# Patient Record
Sex: Male | Born: 1960 | Hispanic: No | Marital: Married | Smoking: Never smoker
Health system: Southern US, Community
[De-identification: ages and names within clinical notes are randomized; demographics above are authoritative.]

## PROBLEM LIST (undated history)

## (undated) DIAGNOSIS — Z86711 Personal history of pulmonary embolism: Secondary | ICD-10-CM

## (undated) DIAGNOSIS — E7800 Pure hypercholesterolemia, unspecified: Secondary | ICD-10-CM

## (undated) DIAGNOSIS — R519 Headache, unspecified: Secondary | ICD-10-CM

## (undated) DIAGNOSIS — I82409 Acute embolism and thrombosis of unspecified deep veins of unspecified lower extremity: Secondary | ICD-10-CM

## (undated) DIAGNOSIS — J189 Pneumonia, unspecified organism: Secondary | ICD-10-CM

## (undated) HISTORY — PX: TONSILLECTOMY: SUR1361

---
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
## 1999-10-20 HISTORY — PX: BACK SURGERY: SHX140

## 2014-10-19 HISTORY — PX: LASIK: SHX215

## 2017-09-18 DIAGNOSIS — Z86711 Personal history of pulmonary embolism: Secondary | ICD-10-CM

## 2017-09-18 DIAGNOSIS — I82409 Acute embolism and thrombosis of unspecified deep veins of unspecified lower extremity: Secondary | ICD-10-CM

## 2017-09-18 HISTORY — DX: Acute embolism and thrombosis of unspecified deep veins of unspecified lower extremity: I82.409

## 2017-09-18 HISTORY — DX: Personal history of pulmonary embolism: Z86.711

## 2018-07-06 ENCOUNTER — Other Ambulatory Visit: Payer: Self-pay | Admitting: Orthopedic Surgery

## 2018-07-06 DIAGNOSIS — M87851 Other osteonecrosis, right femur: Secondary | ICD-10-CM

## 2018-07-08 ENCOUNTER — Ambulatory Visit: Payer: Self-pay | Admitting: Orthopedic Surgery

## 2018-07-13 ENCOUNTER — Ambulatory Visit
Admission: RE | Admit: 2018-07-13 | Discharge: 2018-07-13 | Disposition: A | Discharge status: Home / Self Care | Payer: Self-pay | Source: Ambulatory Visit | Attending: Orthopedic Surgery | Admitting: Orthopedic Surgery

## 2018-07-13 DIAGNOSIS — M87851 Other osteonecrosis, right femur: Secondary | ICD-10-CM

## 2018-07-22 ENCOUNTER — Other Ambulatory Visit (HOSPITAL_COMMUNITY): Payer: Self-pay | Admitting: Orthopedic Surgery

## 2018-07-22 DIAGNOSIS — M25551 Pain in right hip: Secondary | ICD-10-CM

## 2018-07-27 ENCOUNTER — Other Ambulatory Visit: Payer: Self-pay | Admitting: Orthopedic Surgery

## 2018-08-01 ENCOUNTER — Ambulatory Visit: Payer: Self-pay | Admitting: Orthopedic Surgery

## 2018-08-01 DIAGNOSIS — M87051 Idiopathic aseptic necrosis of right femur: Secondary | ICD-10-CM

## 2018-08-01 NOTE — H&P (View-Only) (Signed)
TOTAL HIP ADMISSION H&P  Patient is admitted for right total hip arthroplasty.  Subjective:  Chief Complaint: right hip pain  HPI: John Guzman, 57 y.o. male, has a history of pain and functional disability in the right hip(s) due to AVN and patient has failed non-surgical conservative treatments for greater than 12 weeks to include NSAID's and/or analgesics, flexibility and strengthening excercises, use of assistive devices, weight reduction as appropriate and activity modification.  Onset of symptoms was abrupt starting 2 years ago with gradually worsening course since that time.The patient noted no past surgery on the right hip(s).  Patient currently rates pain in the right hip at 10 out of 10 with activity. Patient has night pain, worsening of pain with activity and weight bearing, trendelenberg gait, pain that interfers with activities of daily living and pain with passive range of motion. Patient has evidence of subchondral sclerosis by imaging studies. This condition presents safety issues increasing the risk of falls. This patient has had avascular necrosis of the hip. There is no current active infection.  There are no active problems to display for this patient.  Past Medical History:  Diagnosis Date  . DVT of lower extremity (deep venous thrombosis) (HCC) 09/2017   Right Lower Extremity  . Elevated cholesterol   . Hx of pulmonary embolus 09/2017    Past Surgical History:  Procedure Laterality Date  . ANKLE SURGERY Right 1981   metal in place   . BACK SURGERY  2001  . IR IVC FILTER PLMT / S&I /IMG GUID/MOD SED  08/17/2018  . IR RADIOLOGIST EVAL & MGMT  08/04/2018  . LASIK Bilateral 2016    No current facility-administered medications for this visit.    No current outpatient medications on file.   Facility-Administered Medications Ordered in Other Visits  Medication Dose Route Frequency Provider Last Rate Last Dose  . 0.9 %  sodium chloride infusion   Intravenous  Continuous Alyanah Elliott, MD      . acetaminophen (OFIRMEV) 10 MG/ML IV           . ceFAZolin (ANCEF) IVPB 2g/100 mL premix  2 g Intravenous On Call to OR Linkon Siverson, MD      . chlorhexidine (HIBICLENS) 4 % liquid 4 application  60 mL Topical Once Jamelle Goldston, MD      . chlorhexidine (HIBICLENS) 4 % liquid 4 application  60 mL Topical Once Lovelee Forner, MD      . povidone-iodine 10 % swab 2 application  2 application Topical Once Eland Lamantia, MD      . tranexamic acid (CYKLOKAPRON) IVPB 1,000 mg  1,000 mg Intravenous To OR Gee Habig, MD       No Known Allergies  Social History   Tobacco Use  . Smoking status: Never Smoker  . Smokeless tobacco: Never Used  Substance Use Topics  . Alcohol use: Yes    Alcohol/week: 5.0 standard drinks    Types: 5 Cans of beer per week    No family history on file.   Review of Systems  Constitutional: Negative.   HENT: Negative.   Eyes: Negative.   Respiratory: Negative.   Cardiovascular: Negative.   Gastrointestinal: Positive for blood in stool and diarrhea.  Genitourinary: Positive for frequency.  Musculoskeletal: Positive for back pain, joint pain and myalgias.  Skin: Negative.   Neurological: Negative.   Endo/Heme/Allergies: Negative.   Psychiatric/Behavioral: Negative.     Objective:  Physical Exam  Vitals reviewed. Constitutional: He is oriented to person,   place, and time. He appears well-developed and well-nourished.  HENT:  Head: Normocephalic and atraumatic.  Eyes: Pupils are equal, round, and reactive to light. Conjunctivae are normal.  Cardiovascular: Normal rate, regular rhythm and intact distal pulses.  Respiratory: Effort normal. No respiratory distress.  GI: Soft. He exhibits no distension.  Genitourinary:  Genitourinary Comments: deferred  Musculoskeletal:       Right hip: He exhibits decreased range of motion and bony tenderness.  Neurological: He is alert and oriented to person, place, and time.  He has normal reflexes.  Skin: Skin is warm and dry.  Psychiatric: He has a normal mood and affect. His behavior is normal. Judgment and thought content normal.    Vital signs in last 24 hours: @VSRANGES@  Labs:   Estimated body mass index is 30.21 kg/m as calculated from the following:   Height as of 08/24/18: 5' 6.5" (1.689 m).   Weight as of 08/24/18: 86.2 kg.   Imaging Review Plain radiographs demonstrate AVN of the right hip(s). The bone quality appears to be adequate for age and reported activity level.    Preoperative templating of the joint replacement has been completed, documented, and submitted to the Operating Room personnel in order to optimize intra-operative equipment management.     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 The treatment options including medical management, injection therapy, arthroscopy and arthroplasty were discussed at length. The risks and benefits of total hip arthroplasty were presented and reviewed. The risks due to aseptic loosening, infection, stiffness, dislocation/subluxation,  thromboembolic complications and other imponderables were discussed.  The patient acknowledged the explanation, agreed to proceed with the plan and consent was signed. Patient is being admitted for inpatient treatment for surgery, pain control, PT, OT, prophylactic antibiotics, VTE prophylaxis, progressive ambulation and ADL's and discharge planning.The patient is planning to be discharged home with HEP. Will need DME. Temp IVC filter ordered due to h/o PE. 

## 2018-08-01 NOTE — H&P (Signed)
TOTAL HIP ADMISSION H&P  Patient is admitted for right total hip arthroplasty.  Subjective:  Chief Complaint: right hip pain  HPI: John Guzman, 57 y.o. male, has a history of pain and functional disability in the right hip(s) due to AVN and patient has failed non-surgical conservative treatments for greater than 12 weeks to include NSAID's and/or analgesics, flexibility and strengthening excercises, use of assistive devices, weight reduction as appropriate and activity modification.  Onset of symptoms was abrupt starting 2 years ago with gradually worsening course since that time.The patient noted no past surgery on the right hip(s).  Patient currently rates pain in the right hip at 10 out of 10 with activity. Patient has night pain, worsening of pain with activity and weight bearing, trendelenberg gait, pain that interfers with activities of daily living and pain with passive range of motion. Patient has evidence of subchondral sclerosis by imaging studies. This condition presents safety issues increasing the risk of falls. This patient has had avascular necrosis of the hip. There is no current active infection.  There are no active problems to display for this patient.  Past Medical History:  Diagnosis Date  . DVT of lower extremity (deep venous thrombosis) (HCC) 09/2017   Right Lower Extremity  . Elevated cholesterol   . Hx of pulmonary embolus 09/2017    Past Surgical History:  Procedure Laterality Date  . ANKLE SURGERY Right 1981   metal in place   . BACK SURGERY  2001  . IR IVC FILTER PLMT / S&I /IMG GUID/MOD SED  08/17/2018  . IR RADIOLOGIST EVAL & MGMT  08/04/2018  . LASIK Bilateral 2016    No current facility-administered medications for this visit.    No current outpatient medications on file.   Facility-Administered Medications Ordered in Other Visits  Medication Dose Route Frequency Provider Last Rate Last Dose  . 0.9 %  sodium chloride infusion   Intravenous  Continuous Jennessy Sandridge, Arlys John, MD      . acetaminophen (OFIRMEV) 10 MG/ML IV           . ceFAZolin (ANCEF) IVPB 2g/100 mL premix  2 g Intravenous On Call to OR Lavonia Eager, Arlys John, MD      . chlorhexidine (HIBICLENS) 4 % liquid 4 application  60 mL Topical Once Spirit Wernli, Arlys John, MD      . chlorhexidine (HIBICLENS) 4 % liquid 4 application  60 mL Topical Once Andreia Gandolfi, Arlys John, MD      . povidone-iodine 10 % swab 2 application  2 application Topical Once Jonny Longino, Arlys John, MD      . tranexamic acid (CYKLOKAPRON) IVPB 1,000 mg  1,000 mg Intravenous To OR Johnrobert Foti, Arlys John, MD       No Known Allergies  Social History   Tobacco Use  . Smoking status: Never Smoker  . Smokeless tobacco: Never Used  Substance Use Topics  . Alcohol use: Yes    Alcohol/week: 5.0 standard drinks    Types: 5 Cans of beer per week    No family history on file.   Review of Systems  Constitutional: Negative.   HENT: Negative.   Eyes: Negative.   Respiratory: Negative.   Cardiovascular: Negative.   Gastrointestinal: Positive for blood in stool and diarrhea.  Genitourinary: Positive for frequency.  Musculoskeletal: Positive for back pain, joint pain and myalgias.  Skin: Negative.   Neurological: Negative.   Endo/Heme/Allergies: Negative.   Psychiatric/Behavioral: Negative.     Objective:  Physical Exam  Vitals reviewed. Constitutional: He is oriented to person,  place, and time. He appears well-developed and well-nourished.  HENT:  Head: Normocephalic and atraumatic.  Eyes: Pupils are equal, round, and reactive to light. Conjunctivae are normal.  Cardiovascular: Normal rate, regular rhythm and intact distal pulses.  Respiratory: Effort normal. No respiratory distress.  GI: Soft. He exhibits no distension.  Genitourinary:  Genitourinary Comments: deferred  Musculoskeletal:       Right hip: He exhibits decreased range of motion and bony tenderness.  Neurological: He is alert and oriented to person, place, and time.  He has normal reflexes.  Skin: Skin is warm and dry.  Psychiatric: He has a normal mood and affect. His behavior is normal. Judgment and thought content normal.    Vital signs in last 24 hours: @VSRANGES @  Labs:   Estimated body mass index is 30.21 kg/m as calculated from the following:   Height as of 08/24/18: 5' 6.5" (1.689 m).   Weight as of 08/24/18: 86.2 kg.   Imaging Review Plain radiographs demonstrate AVN of the right hip(s). The bone quality appears to be adequate for age and reported activity level.    Preoperative templating of the joint replacement has been completed, documented, and submitted to the Operating Room personnel in order to optimize intra-operative equipment management.     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 The treatment options including medical management, injection therapy, arthroscopy and arthroplasty were discussed at length. The risks and benefits of total hip arthroplasty were presented and reviewed. The risks due to aseptic loosening, infection, stiffness, dislocation/subluxation,  thromboembolic complications and other imponderables were discussed.  The patient acknowledged the explanation, agreed to proceed with the plan and consent was signed. Patient is being admitted for inpatient treatment for surgery, pain control, PT, OT, prophylactic antibiotics, VTE prophylaxis, progressive ambulation and ADL's and discharge planning.The patient is planning to be discharged home with HEP. Will need DME. Temp IVC filter ordered due to h/o PE.

## 2018-08-04 ENCOUNTER — Ambulatory Visit
Admission: RE | Admit: 2018-08-04 | Discharge: 2018-08-04 | Disposition: A | Discharge status: Home / Self Care | Payer: Self-pay | Source: Ambulatory Visit | Attending: Orthopedic Surgery | Admitting: Orthopedic Surgery

## 2018-08-04 DIAGNOSIS — I2699 Other pulmonary embolism without acute cor pulmonale: Secondary | ICD-10-CM

## 2018-08-04 HISTORY — DX: Acute embolism and thrombosis of unspecified deep veins of unspecified lower extremity: I82.409

## 2018-08-04 HISTORY — DX: Personal history of pulmonary embolism: Z86.711

## 2018-08-04 HISTORY — PX: IR RADIOLOGIST EVAL & MGMT: IMG5224

## 2018-08-04 NOTE — Consult Note (Signed)
Chief Complaint: Patient was seen in consultation today for  Chief Complaint  Patient presents with  . Consult    Consult for IVC placement     at the request of Swinteck,Brian  Referring Physician(s): Swinteck,Brian  History of Present Illness: John Guzman is a 57 y.o. male with a history of pulmonary emboli and right lower extremity DVT diagnosed in December 2018, currently on Eliquis.  He is scheduled for right hip replacement surgery on  August 24, 2018 and is referred for consultation regarding a treatable IVC filter placement.  No history of IVC filter.  No history of abdominal surgery.  No other known venous pathology.  No diagnosis of coagulopathy or blood dyscrasia.   Past Medical History:  Diagnosis Date  . DVT of lower extremity (deep venous thrombosis) (HCC) 09/2017   Right Lower Extremity  . Hx of pulmonary embolus 09/2017      Allergies: Patient has no known allergies.  Medications: Prior to Admission medications   Medication Sig Start Date End Date Taking? Authorizing Provider  acetaminophen (TYLENOL) 500 MG tablet Take 500 mg by mouth every 6 (six) hours as needed.   Yes [provider]  apixaban (ELIQUIS) 5 MG TABS tablet Take 5 mg by mouth 2 (two) times daily.   Yes [provider]     No family history on file.  Social History   Socioeconomic History  . Marital status: Married    Spouse name: Not on file  . Number of children: Not on file  . Years of education: Not on file  . Highest education level: Not on file  Occupational History  . Not on file  Social Needs  . Financial resource strain: Not on file  . Food insecurity:    Worry: Not on file    Inability: Not on file  . Transportation needs:    Medical: Not on file    Non-medical: Not on file  Tobacco Use  . Smoking status: Never Smoker  . Smokeless tobacco: Never Used  Substance and Sexual Activity  . Alcohol use: Yes    Alcohol/week: 5.0 standard  drinks    Types: 5 Cans of beer per week  . Drug use: Never  . Sexual activity: Not on file  Lifestyle  . Physical activity:    Days per week: Not on file    Minutes per session: Not on file  . Stress: Not on file  Relationships  . Social connections:    Talks on phone: Not on file    Gets together: Not on file    Attends religious service: Not on file    Active member of club or organization: Not on file    Attends meetings of clubs or organizations: Not on file    Relationship status: Not on file  Other Topics Concern  . Not on file  Social History Narrative  . Not on file    ECOG Status: 1 - Symptomatic but completely ambulatory    Physical Exam  Vital Signs: BP 123/68   Pulse 64   Temp 97.8 F (36.6 C) (Oral)   Resp 14   Ht 5\' 7"  (1.702 m)   Wt 85.3 kg   SpO2 97%   BMI 29.44 kg/m  Constitutional: Oriented to person, place, and time. Well-developed and well-nourished. No distress.  Last Weight  Most recent update: 08/04/2018  8:04 AM   Weight  85.3 kg (188 lb)  HENT:  Head: Normocephalic and atraumatic.  Eyes: Conjunctivae and EOM are normal. Right eye exhibits no discharge. Left eye exhibits no discharge. No scleral icterus.  Neck: No JVD present.  Pulmonary/Chest: Effort normal. No stridor. No respiratory distress.  Abdomen: soft, non distended Neurological:  alert and oriented to person, place, and time.  Skin: Skin is warm and dry.  not diaphoretic.  Psychiatric:   normal mood and affect.   behavior is normal. Judgment and thought content normal.   Mallampati Score:     Review of Systems Review of Systems: A 12 point ROS discussed and pertinent positives are indicated in the HPI above.  All other systems are negative. Imaging: No results found.  Labs:  CBC: No results for input(s): WBC, HGB, HCT, PLT in the last 8760 hours.  COAGS: No results for input(s): INR, APTT in the last 8760 hours.  BMP: No results for input(s): NA,  K, CL, CO2, GLUCOSE, BUN, CALCIUM, CREATININE, GFRNONAA, GFRAA in the last 8760 hours.  Invalid input(s): CMP  LIVER FUNCTION TESTS: No results for input(s): BILITOT, AST, ALT, ALKPHOS, PROT, ALBUMIN in the last 8760 hours.  TUMOR MARKERS: No results for input(s): AFPTM, CEA, CA199, CHROMGRNA in the last 8760 hours.  Assessment and Plan:  My impression is that this patient is an appropriate candidate for retrievable IVC filter placement as PE prophylaxis during the time of hip replacement surgery and postop recovery.  Once safely beyond surgery and able to resume anticoagulation, we can see him back for filter retrieval as he likely would not need this lifelong.  I reviewed with the patient and his wife the pathophysiology of venous thromboembolic disease.  We reviewed the action of anticoagulants and the IVC filter.  We reviewed that the filter does not prevent DVT but does significantly decrease likelihood of symptomatic pulmonary emboli.  As I have no access to   or records of any cross-sectional imaging of his abdominal venous anatomy, we reviewed the need to perform venography to confirm venous anatomy and establish a safe place for filter placement.  We reviewed in detail the technique of transjugular IVC filter placement and retrieval.  We reviewed the   indications for filter placement.  We reviewed possible risks and complications of IVC filters.  They seemed understand and did ask appropriate questions,  Which were all answered.  He is motivated to proceed.  Accordingly, we can set this up as an outpatient under moderate sedation at his convenience.  Thank you for this interesting consult.  I greatly enjoyed meeting John Guzman and look forward to participating in their care.  A copy of this report was sent to the requesting provider on this date.  Electronically Signed: Durwin Glaze 08/04/2018, 8:34 AM   I spent a total of  30 Minutes   in face to face in clinical  consultation, greater than 50% of which was counseling/coordinating care for perioperative caval filtration.

## 2018-08-09 ENCOUNTER — Other Ambulatory Visit (HOSPITAL_COMMUNITY): Payer: Self-pay | Admitting: Interventional Radiology

## 2018-08-09 DIAGNOSIS — Z86718 Personal history of other venous thrombosis and embolism: Secondary | ICD-10-CM

## 2018-08-15 ENCOUNTER — Encounter (HOSPITAL_COMMUNITY): Payer: Self-pay

## 2018-08-15 ENCOUNTER — Other Ambulatory Visit: Payer: Self-pay | Admitting: Radiology

## 2018-08-15 NOTE — Progress Notes (Signed)
Clearance Dr Arlana Pouch 06-29-18 on chart   EKG 04-2018 on chart from [facility]   LOV Dr Arlana Pouch 04-24-18 on chart

## 2018-08-15 NOTE — Patient Instructions (Signed)
John Guzman  08/15/2018   Your procedure is scheduled on: 08-24-18   Report to [facility] Main  Entrance    Report to admitting at 6:00AM    Call this number if you have problems the morning of surgery [phone]     Remember: Do not eat food or drink liquids :After Midnight. BRUSH YOUR TEETH MORNING OF SURGERY AND RINSE YOUR MOUTH OUT, NO CHEWING GUM CANDY OR MINTS.     Take these medicines the morning of surgery with A SIP OF WATER: none                                You may not have any metal on your body including hair pins and              piercings  Do not wear jewelry, make-up, lotions, powders or perfumes, deodorant                         Men may shave face and neck.   Do not bring valuables to the hospital. [facility] IS NOT             RESPONSIBLE   FOR VALUABLES.  Contacts, dentures or bridgework may not be worn into surgery.  Leave suitcase in the car. After surgery it may be brought to your room.                  Please read over the following fact sheets you were given: _____________________________________________________________________             [facility] - Preparing for Surgery Before surgery, you can play an important role.  Because skin is not sterile, your skin needs to be as free of germs as possible.  You can reduce the number of germs on your skin by washing with CHG (chlorahexidine gluconate) soap before surgery.  CHG is an antiseptic cleaner which kills germs and bonds with the skin to continue killing germs even after washing. Please DO NOT use if you have an allergy to CHG or antibacterial soaps.  If your skin becomes reddened/irritated stop using the CHG and inform your nurse when you arrive at Short Stay. Do not shave (including legs and underarms) for at least 48 hours prior to the first CHG shower.  You may shave your face/neck. Please follow these instructions carefully:  1.  Shower with CHG Soap the night  before surgery and the  morning of Surgery.  2.  If you choose to wash your hair, wash your hair first as usual with your  normal  shampoo.  3.  After you shampoo, rinse your hair and body thoroughly to remove the  shampoo.                           4.  Use CHG as you would any other liquid soap.  You can apply chg directly  to the skin and wash                       Gently with a scrungie or clean washcloth.  5.  Apply the CHG Soap to your body ONLY FROM THE NECK DOWN.   Do not use on face/ open  Wound or open sores. Avoid contact with eyes, ears mouth and genitals (private parts).                       Wash face,  Genitals (private parts) with your normal soap.             6.  Wash thoroughly, paying special attention to the area where your surgery  will be performed.  7.  Thoroughly rinse your body with warm water from the neck down.  8.  DO NOT shower/wash with your normal soap after using and rinsing off  the CHG Soap.                9.  Pat yourself dry with a clean towel.            10.  Wear clean pajamas.            11.  Place clean sheets on your bed the night of your first shower and do not  sleep with pets. Day of Surgery : Do not apply any lotions/deodorants the morning of surgery.  Please wear clean clothes to the hospital/surgery center.  FAILURE TO FOLLOW THESE INSTRUCTIONS MAY RESULT IN THE CANCELLATION OF YOUR SURGERY PATIENT SIGNATURE_________________________________  NURSE SIGNATURE__________________________________  ________________________________________________________________________   John Guzman  An incentive spirometer is a tool that can help keep your lungs clear and active. This tool measures how well you are filling your lungs with each breath. Taking long deep breaths may help reverse or decrease the chance of developing breathing (pulmonary) problems (especially infection) following:  A long period of time when you are  unable to move or be active. BEFORE THE PROCEDURE   If the spirometer includes an indicator to show your best effort, your nurse or respiratory therapist will set it to a desired goal.  If possible, sit up straight or lean slightly forward. Try not to slouch.  Hold the incentive spirometer in an upright position. INSTRUCTIONS FOR USE  1. Sit on the edge of your bed if possible, or sit up as far as you can in bed or on a chair. 2. Hold the incentive spirometer in an upright position. 3. Breathe out normally. 4. Place the mouthpiece in your mouth and seal your lips tightly around it. 5. Breathe in slowly and as deeply as possible, raising the piston or the ball toward the top of the column. 6. Hold your breath for 3-5 seconds or for as long as possible. Allow the piston or ball to fall to the bottom of the column. 7. Remove the mouthpiece from your mouth and breathe out normally. 8. Rest for a few seconds and repeat Steps 1 through 7 at least 10 times every 1-2 hours when you are awake. Take your time and take a few normal breaths between deep breaths. 9. The spirometer may include an indicator to show your best effort. Use the indicator as a goal to work toward during each repetition. 10. After each set of 10 deep breaths, practice coughing to be sure your lungs are clear. If you have an incision (the cut made at the time of surgery), support your incision when coughing by placing a pillow or rolled up towels firmly against it. Once you are able to get out of bed, walk around indoors and cough well. You may stop using the incentive spirometer when instructed by your caregiver.  RISKS AND COMPLICATIONS  Take your time so you do not get  dizzy or light-headed.  If you are in pain, you may need to take or ask for pain medication before doing incentive spirometry. It is harder to take a deep breath if you are having pain. AFTER USE  Rest and breathe slowly and easily.  It can be helpful to  keep track of a log of your progress. Your caregiver can provide you with a simple table to help with this. If you are using the spirometer at home, follow these instructions: [place] IF:   You are having difficultly using the spirometer.  You have trouble using the spirometer as often as instructed.  Your pain medication is not giving enough relief while using the spirometer.  You develop fever of 100.5 F (38.1 C) or higher. SEEK IMMEDIATE MEDICAL CARE IF:   You cough up bloody sputum that had not been present before.  You develop fever of 102 F (38.9 C) or greater.  You develop worsening pain at or near the incision site. MAKE SURE YOU:   Understand these instructions.  Will watch your condition.  Will get help right away if you are not doing well or get worse. Document Released: 02/15/2007 Document Revised: 12/28/2011 Document Reviewed: 04/18/2007 ExitCare Patient Information 2014 ExitCare, [place].   ________________________________________________________________________  WHAT IS A BLOOD TRANSFUSION? Blood Transfusion Information  A transfusion is the replacement of blood or some of its parts. Blood is made up of multiple cells which provide different functions.  Red blood cells carry oxygen and are used for blood loss replacement.  White blood cells fight against infection.  Platelets control bleeding.  Plasma helps clot blood.  Other blood products are available for specialized needs, such as hemophilia or other clotting disorders. BEFORE THE TRANSFUSION  Who gives blood for transfusions?   Healthy volunteers who are fully evaluated to make sure their blood is safe. This is blood bank blood. Transfusion therapy is the safest it has ever been in the practice of medicine. Before blood is taken from a donor, a complete history is taken to make sure that person has no history of diseases nor engages in risky social behavior (examples are intravenous drug  use or sexual activity with multiple partners). The donor's travel history is screened to minimize risk of transmitting infections, such as malaria. The donated blood is tested for signs of infectious diseases, such as HIV and hepatitis. The blood is then tested to be sure it is compatible with you in order to minimize the chance of a transfusion reaction. If you or a relative donates blood, this is often done in anticipation of surgery and is not appropriate for emergency situations. It takes many days to process the donated blood. RISKS AND COMPLICATIONS Although transfusion therapy is very safe and saves many lives, the main dangers of transfusion include:   Getting an infectious disease.  Developing a transfusion reaction. This is an allergic reaction to something in the blood you were given. Every precaution is taken to prevent this. The decision to have a blood transfusion has been considered carefully by your caregiver before blood is given. Blood is not given unless the benefits outweigh the risks. AFTER THE TRANSFUSION  Right after receiving a blood transfusion, you will usually feel much better and more energetic. This is especially true if your red blood cells have gotten low (anemic). The transfusion raises the level of the red blood cells which carry oxygen, and this usually causes an energy increase.  The nurse administering the transfusion will  monitor you carefully for complications. HOME CARE INSTRUCTIONS  No special instructions are needed after a transfusion. You may find your energy is better. Speak with your caregiver about any limitations on activity for underlying diseases you may have. SEEK MEDICAL CARE IF:   Your condition is not improving after your transfusion.  You develop redness or irritation at the intravenous (IV) site. SEEK IMMEDIATE MEDICAL CARE IF:  Any of the following symptoms occur over the next 12 hours:  Shaking chills.  You have a temperature by mouth  above 102 F (38.9 C), not controlled by medicine.  Chest, back, or muscle pain.  People around you feel you are not acting correctly or are confused.  Shortness of breath or difficulty breathing.  Dizziness and fainting.  You get a rash or develop hives.  You have a decrease in urine output.  Your urine turns a dark color or changes to pink, red, or brown. Any of the following symptoms occur over the next 10 days:  You have a temperature by mouth above 102 F (38.9 C), not controlled by medicine.  Shortness of breath.  Weakness after normal activity.  The white part of the eye turns yellow (jaundice).  You have a decrease in the amount of urine or are urinating less often.  Your urine turns a dark color or changes to pink, red, or brown. Document Released: 10/02/2000 Document Revised: 12/28/2011 Document Reviewed: 05/21/2008 [facility] Patient Information 2014 [place], [place].  _______________________________________________________________________

## 2018-08-17 ENCOUNTER — Ambulatory Visit (HOSPITAL_COMMUNITY)
Admission: RE | Admit: 2018-08-17 | Discharge: 2018-08-17 | Disposition: A | Discharge status: Home / Self Care | Payer: No Typology Code available for payment source | Source: Ambulatory Visit | Attending: Interventional Radiology | Admitting: Interventional Radiology

## 2018-08-17 ENCOUNTER — Other Ambulatory Visit: Payer: Self-pay

## 2018-08-17 ENCOUNTER — Encounter (HOSPITAL_COMMUNITY): Payer: Self-pay | Admitting: Interventional Radiology

## 2018-08-17 DIAGNOSIS — Z7901 Long term (current) use of anticoagulants: Secondary | ICD-10-CM | POA: Insufficient documentation

## 2018-08-17 DIAGNOSIS — Z86711 Personal history of pulmonary embolism: Secondary | ICD-10-CM | POA: Insufficient documentation

## 2018-08-17 DIAGNOSIS — E7800 Pure hypercholesterolemia, unspecified: Secondary | ICD-10-CM | POA: Insufficient documentation

## 2018-08-17 DIAGNOSIS — I82503 Chronic embolism and thrombosis of unspecified deep veins of lower extremity, bilateral: Secondary | ICD-10-CM | POA: Diagnosis present

## 2018-08-17 DIAGNOSIS — Z86718 Personal history of other venous thrombosis and embolism: Secondary | ICD-10-CM

## 2018-08-17 HISTORY — PX: IR IVC FILTER PLMT / S&I /IMG GUID/MOD SED: IMG701

## 2018-08-17 LAB — CBC WITH DIFFERENTIAL/PLATELET
Abs Immature Granulocytes: 0.02 10*3/uL (ref 0.00–0.07)
BASOS ABS: 0.1 10*3/uL (ref 0.0–0.1)
BASOS PCT: 1 %
EOS ABS: 0.2 10*3/uL (ref 0.0–0.5)
EOS PCT: 4 %
HCT: 47.9 % (ref 39.0–52.0)
Hemoglobin: 15.1 g/dL (ref 13.0–17.0)
IMMATURE GRANULOCYTES: 0 %
Lymphocytes Relative: 40 %
Lymphs Abs: 2 10*3/uL (ref 0.7–4.0)
MCH: 29 pg (ref 26.0–34.0)
MCHC: 31.5 g/dL (ref 30.0–36.0)
MCV: 92.1 fL (ref 80.0–100.0)
Monocytes Absolute: 0.5 10*3/uL (ref 0.1–1.0)
Monocytes Relative: 9 %
NEUTROS PCT: 46 %
NRBC: 0 % (ref 0.0–0.2)
Neutro Abs: 2.3 10*3/uL (ref 1.7–7.7)
PLATELETS: 244 10*3/uL (ref 150–400)
RBC: 5.2 MIL/uL (ref 4.22–5.81)
RDW: 12.3 % (ref 11.5–15.5)
WBC: 5 10*3/uL (ref 4.0–10.5)

## 2018-08-17 LAB — BASIC METABOLIC PANEL
ANION GAP: 9 (ref 5–15)
BUN: 21 mg/dL — ABNORMAL HIGH (ref 6–20)
CALCIUM: 8.8 mg/dL — AB (ref 8.9–10.3)
CO2: 22 mmol/L (ref 22–32)
CREATININE: 0.86 mg/dL (ref 0.61–1.24)
Chloride: 109 mmol/L (ref 98–111)
Glucose, Bld: 102 mg/dL — ABNORMAL HIGH (ref 70–99)
Potassium: 4.3 mmol/L (ref 3.5–5.1)
Sodium: 140 mmol/L (ref 135–145)

## 2018-08-17 LAB — PROTIME-INR
INR: 1
Prothrombin Time: 13.1 seconds (ref 11.4–15.2)

## 2018-08-17 MED ORDER — IOPAMIDOL (ISOVUE-300) INJECTION 61%
50.0000 mL | Freq: Once | INTRAVENOUS | Status: AC | PRN
  Administered 2018-08-17: 35 mL via INTRAVENOUS

## 2018-08-17 MED ORDER — LIDOCAINE HCL (PF) 1 % IJ SOLN
INTRAMUSCULAR | Status: AC | PRN
  Administered 2018-08-17: 5 mL via SUBCUTANEOUS

## 2018-08-17 MED ORDER — FENTANYL CITRATE (PF) 100 MCG/2ML IJ SOLN
INTRAMUSCULAR | Status: AC | PRN
  Administered 2018-08-17: 25 ug via INTRAVENOUS
  Administered 2018-08-17: 50 ug via INTRAVENOUS

## 2018-08-17 MED ORDER — MIDAZOLAM HCL 2 MG/2ML IJ SOLN
INTRAMUSCULAR | Status: AC | PRN
  Administered 2018-08-17 (×2): 1 mg via INTRAVENOUS

## 2018-08-17 MED ORDER — LIDOCAINE HCL 1 % IJ SOLN
INTRAMUSCULAR | Status: AC
  Filled 2018-08-17: qty 20

## 2018-08-17 MED ORDER — IOPAMIDOL (ISOVUE-300) INJECTION 61%
INTRAVENOUS | Status: AC
  Administered 2018-08-17: 35 mL via INTRAVENOUS
  Filled 2018-08-17: qty 50

## 2018-08-17 MED ORDER — MIDAZOLAM HCL 2 MG/2ML IJ SOLN
INTRAMUSCULAR | Status: AC
  Filled 2018-08-17: qty 2

## 2018-08-17 MED ORDER — SODIUM CHLORIDE 0.9 % IV SOLN
INTRAVENOUS | Status: DC
  Administered 2018-08-17: 09:00:00 via INTRAVENOUS

## 2018-08-17 MED ORDER — IOHEXOL 300 MG/ML  SOLN
100.0000 mL | Freq: Once | INTRAMUSCULAR | Status: AC | PRN
  Administered 2018-08-17: 30 mL via INTRAVENOUS

## 2018-08-17 MED ORDER — FENTANYL CITRATE (PF) 100 MCG/2ML IJ SOLN
INTRAMUSCULAR | Status: AC
  Filled 2018-08-17: qty 2

## 2018-08-17 MED ORDER — ACETAMINOPHEN 10 MG/ML IV SOLN: 1000.0000 mg | INTRAVENOUS | Status: DC

## 2018-08-17 NOTE — Discharge Instructions (Signed)
Moderate Conscious Sedation, Adult, Care After °These instructions provide you with information about caring for yourself after your procedure. Your health care provider may also give you more specific instructions. Your treatment has been planned according to current medical practices, but problems sometimes occur. Call your health care provider if you have any problems or questions after your procedure. °What can I expect after the procedure? °After your procedure, it is common: °· To feel sleepy for several hours. °· To feel clumsy and have poor balance for several hours. °· To have poor judgment for several hours. °· To vomit if you eat too soon. ° °Follow these instructions at home: °For at least 24 hours after the procedure: ° °· Do not: °? Participate in activities where you could fall or become injured. °? Drive. °? Use heavy machinery. °? Drink alcohol. °? Take sleeping pills or medicines that cause drowsiness. °? Make important decisions or sign legal documents. °? Take care of children on your own. °· Rest. °Eating and drinking °· Follow the diet recommended by your health care provider. °· If you vomit: °? Drink water, juice, or soup when you can drink without vomiting. °? Make sure you have little or no nausea before eating solid foods. °General instructions °· Have a responsible adult stay with you until you are awake and alert. °· Take over-the-counter and prescription medicines only as told by your health care provider. °· If you smoke, do not smoke without supervision. °· Keep all follow-up visits as told by your health care provider. This is important. °Contact a health care provider if: °· You keep feeling nauseous or you keep vomiting. °· You feel light-headed. °· You develop a rash. °· You have a fever. °Get help right away if: °· You have trouble breathing. °This information is not intended to replace advice given to you by your health care provider. Make sure you discuss any questions you have  with your health care provider. °Document Released: 07/26/2013 Document Revised: 03/09/2016 Document Reviewed: 01/25/2016 °Elsevier Interactive Patient Education © 2018 [employer] ° ° °Inferior Vena Cava Filter Insertion, Care After °This sheet gives you information about how to care for yourself after your procedure. Your health care provider may also give you more specific instructions. If you have problems or questions, contact your health care provider. °What can I expect after the procedure? °After your procedure, it is common to have: °· Mild pain in the area where the filter was inserted. °· Mild bruising in the area where the filter was inserted. ° °Follow these instructions at home: °Insertion site care °· Follow instructions from your health care provider about how to take care of the site where a catheter was inserted at your neck or groin (insertion site). Make sure you: °? Wash your hands with soap and water before you change your bandage (dressing). If soap and water are not available, use hand sanitizer. °? Change your dressing as told by your health care provider.  You may remove your dressing tomorrow. °· Check your insertion site every day for signs of infection. Check for: °? More redness, swelling, or pain. °? More fluid or blood. °? Warmth. °? Pus or a bad smell. °· Keep the insertion site clean and dry. °· Do not shower, bathe, use a hot tub, or let the dressing get wet until your health care provider approves.  You may shower tomorrow. °General instructions °· Take over-the-counter and prescription medicines only as told by your health care provider. °·   Avoid heavy lifting or hard activities for 48 hours after the procedure or as told by your health care provider. °· Do not drive for 24 hours if you were given a a medicine to help you relax (sedative). °· Do not drive or use heavy machinery while taking prescription pain medicine. °· Do not go back to school or work until your health care  provider approves. °· Keep all follow-up visits as told by your health care provider. This is important. °Contact a health care provider if: °· You have more redness, swelling, or pain around your insertion site. °· You have more fluid or blood coming from your insertion site. °· Your insertion site feels warm to the touch. °· You have pus or a bad smell coming from your insertion site. °· You have a fever. °· You are dizzy. °· You have nausea and vomiting. °· You develop a rash. °Get help right away if: °· You develop chest pain, a cough, or difficulty breathing. °· You develop shortness of breath, feel faint, or pass out. °· You cough up blood. °· You have severe pain in your abdomen. °· You develop swelling and discoloration or pain in your legs. °· Your legs become pale and cold or blue. °· You develop weakness, difficulty moving your arms or legs, or balance problems. °· You develop problems with speech or vision. °These symptoms may represent a serious problem that is an emergency. Do not wait to see if the symptoms will go away. Get medical help right away. Call your local emergency services (911 in the [place] Do not drive yourself to the hospital. °Summary °· After your insertion procedure, it is common to have mild pain and bruising. °· Do not shower, bathe, use a hot tub, or let the dressing get wet until your health care provider approves. °· Every day, check for signs of infection where a catheter was inserted at your neck or groin (insertion site). °This information is not intended to replace advice given to you by your health care provider. Make sure you discuss any questions you have with your health care provider. °Document Released: 07/26/2013 Document Revised: 08/26/2016 Document Reviewed: 08/26/2016 °Elsevier Interactive Patient Education © 2017 Elsevier Inc. ° ° ° ° ° °

## 2018-08-17 NOTE — H&P (Signed)
Referring Physician(s): Swinteck,B  Supervising Physician: Gilmer Mor  Patient Status:  [facility] OP  Chief Complaint:  "I'm having a filter put in"  Subjective: Patient familiar to IR service from recent consultation with Dr. Deanne Coffer on 08/04/2018 to discuss placement of an IVC filter.  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  He is scheduled for right hip replacement surgery on 08/24/2018 and IVC filter placement is requested as PE prophylaxis during the time of surgery and postop recovery.  He currently denies fever, headache, chest pain, dyspnea, cough, abdominal pain, nausea, vomiting or bleeding.  He does have some occasional back pain/rt hip pain.   Past Medical History:  Diagnosis Date  . DVT of lower extremity (deep venous thrombosis) (HCC) 09/2017   Right Lower Extremity  . Elevated cholesterol   . Hx of pulmonary embolus 09/2017      Allergies: Patient has no known allergies.  Medications: Prior to Admission medications   Medication Sig Start Date End Date Taking? Authorizing Provider  apixaban (ELIQUIS) 5 MG TABS tablet Take 5 mg by mouth 2 (two) times daily.   Yes [provider]  acetaminophen (TYLENOL) 500 MG tablet Take 500 mg by mouth every 6 (six) hours as needed.    [provider]     Vital Signs: BP 122/79   Pulse (!) 57   Temp 98.3 F (36.8 C) (Oral)   Resp 16   Ht 5\' 7"  (1.702 m)   Wt 188 lb (85.3 kg)   SpO2 98%   BMI 29.44 kg/m   Physical Exam awake, alert.  Chest clear to auscultation bilaterally.  Heart with slightly bradycardic but regular rhythm.  Abdomen soft, positive bowel sounds, nontender.  Some mild bilateral lower extremity edema.  Imaging: No results found.  Labs:  CBC: Recent Labs    08/17/18 0757  WBC 5.0  HGB 15.1  HCT 47.9  PLT 244    COAGS: Recent Labs    08/17/18 0757  INR 1.00    BMP: Recent Labs     08/17/18 0757  NA 140  K 4.3  CL 109  CO2 22  GLUCOSE 102*  BUN 21*  CALCIUM 8.8*  CREATININE 0.86  GFRNONAA >60  GFRAA >60    LIVER FUNCTION TESTS: No results for input(s): BILITOT, AST, ALT, ALKPHOS, PROT, ALBUMIN in the last 8760 hours.  Assessment and Plan: Pt with history of pulmonary emboli and right lower extremity DVT diagnosed in December 2018, currently on Eliquis.  He is scheduled for right hip replacement surgery on 08/24/2018 and IVC filter placement is requested as PE prophylaxis during the time of surgery and postop recovery.  He was seen in consultation by Dr. Deanne Coffer on 08/04/2018 and deemed an appropriate candidate for filter placement.  He presents today for the procedure.Risks and benefits discussed with the patient/spouse including, but not limited to bleeding, infection, contrast induced renal failure, filter fracture or migration which can lead to emergency surgery or even death, strut penetration with damage or irritation to adjacent structures and caval thrombosis.  All of the patient's questions were answered, patient is agreeable to proceed. Consent signed and in chart.     Electronically Signed: D. Jeananne Rama, PA-C 08/17/2018, 9:12 AM   I spent a total of 25 minutes  at the the patient's bedside AND on the patient's hospital floor or unit, greater than 50% of which was counseling/coordinating care for IVC  filter placement

## 2018-08-17 NOTE — Procedures (Signed)
Interventional Radiology Procedure Note  Procedure: Placement of an IVC filter, retrievable Bard Denali, via right IJ approach.   Complications: None Recommendations:  - Ok to shower tomorrow - Do not submerge for 7 days - Routine care - When able to have IVC filter removed, please have patient referred for follow up at VIR clinic.  [phone]   Signed,  Yvone Neu. Loreta Ave, DO

## 2018-08-18 ENCOUNTER — Encounter (HOSPITAL_COMMUNITY): Payer: Self-pay

## 2018-08-18 ENCOUNTER — Encounter (HOSPITAL_COMMUNITY)
Admission: RE | Admit: 2018-08-18 | Discharge: 2018-08-18 | Disposition: A | Discharge status: Home / Self Care | Payer: No Typology Code available for payment source | Source: Ambulatory Visit | Attending: Orthopedic Surgery | Admitting: Orthopedic Surgery

## 2018-08-18 ENCOUNTER — Other Ambulatory Visit: Payer: Self-pay

## 2018-08-18 DIAGNOSIS — Z01812 Encounter for preprocedural laboratory examination: Secondary | ICD-10-CM | POA: Diagnosis not present

## 2018-08-18 DIAGNOSIS — M1611 Unilateral primary osteoarthritis, right hip: Secondary | ICD-10-CM | POA: Insufficient documentation

## 2018-08-18 HISTORY — DX: Pure hypercholesterolemia, unspecified: E78.00

## 2018-08-18 LAB — SURGICAL PCR SCREEN
MRSA, PCR: NEGATIVE
Staphylococcus aureus: POSITIVE — AB

## 2018-08-18 LAB — ABO/RH: ABO/RH(D): A POS

## 2018-08-24 ENCOUNTER — Inpatient Hospital Stay (HOSPITAL_COMMUNITY)
Admission: RE | Admit: 2018-08-24 | Discharge: 2018-08-25 | DRG: 470 | Disposition: A | Discharge status: Home / Self Care | Payer: Non-veteran care | Source: Ambulatory Visit | Attending: Orthopedic Surgery | Admitting: Orthopedic Surgery

## 2018-08-24 ENCOUNTER — Encounter (HOSPITAL_COMMUNITY): Payer: Self-pay | Admitting: General Practice

## 2018-08-24 ENCOUNTER — Inpatient Hospital Stay (HOSPITAL_COMMUNITY): Payer: Non-veteran care

## 2018-08-24 ENCOUNTER — Other Ambulatory Visit: Payer: Self-pay

## 2018-08-24 ENCOUNTER — Encounter (HOSPITAL_COMMUNITY)
Admission: RE | Disposition: A | Discharge status: Home / Self Care | Payer: Self-pay | Source: Ambulatory Visit | Attending: Orthopedic Surgery

## 2018-08-24 ENCOUNTER — Inpatient Hospital Stay (HOSPITAL_COMMUNITY): Payer: Non-veteran care | Admitting: Certified Registered Nurse Anesthetist

## 2018-08-24 DIAGNOSIS — M87051 Idiopathic aseptic necrosis of right femur: Secondary | ICD-10-CM

## 2018-08-24 DIAGNOSIS — E7800 Pure hypercholesterolemia, unspecified: Secondary | ICD-10-CM | POA: Diagnosis present

## 2018-08-24 DIAGNOSIS — M879 Osteonecrosis, unspecified: Principal | ICD-10-CM | POA: Diagnosis present

## 2018-08-24 DIAGNOSIS — Z86711 Personal history of pulmonary embolism: Secondary | ICD-10-CM | POA: Diagnosis not present

## 2018-08-24 DIAGNOSIS — Z09 Encounter for follow-up examination after completed treatment for conditions other than malignant neoplasm: Secondary | ICD-10-CM

## 2018-08-24 DIAGNOSIS — M1611 Unilateral primary osteoarthritis, right hip: Secondary | ICD-10-CM | POA: Diagnosis present

## 2018-08-24 DIAGNOSIS — Z86718 Personal history of other venous thrombosis and embolism: Secondary | ICD-10-CM | POA: Diagnosis not present

## 2018-08-24 HISTORY — PX: TOTAL HIP ARTHROPLASTY: SHX124

## 2018-08-24 LAB — TYPE AND SCREEN
ABO/RH(D): A POS
ANTIBODY SCREEN: NEGATIVE

## 2018-08-24 SURGERY — ARTHROPLASTY, HIP, TOTAL, ANTERIOR APPROACH
Anesthesia: Spinal | Site: Hip | Laterality: Right

## 2018-08-24 MED ORDER — HYDROCODONE-ACETAMINOPHEN 5-325 MG PO TABS
1.0000 | ORAL_TABLET | ORAL | Status: DC | PRN
  Administered 2018-08-25: 2 via ORAL
  Filled 2018-08-24: qty 2

## 2018-08-24 MED ORDER — 0.9 % SODIUM CHLORIDE (POUR BTL) OPTIME
TOPICAL | Status: DC | PRN
  Administered 2018-08-24: 1000 mL

## 2018-08-24 MED ORDER — ONDANSETRON HCL 4 MG/2ML IJ SOLN
INTRAMUSCULAR | Status: DC | PRN
  Administered 2018-08-24: 4 mg via INTRAVENOUS

## 2018-08-24 MED ORDER — MIDAZOLAM HCL 2 MG/2ML IJ SOLN
INTRAMUSCULAR | Status: AC
  Filled 2018-08-24: qty 2

## 2018-08-24 MED ORDER — PHENOL 1.4 % MT LIQD: 1.0000 | OROMUCOSAL | Status: DC | PRN

## 2018-08-24 MED ORDER — METOCLOPRAMIDE HCL 5 MG/ML IJ SOLN: 5.0000 mg | Freq: Three times a day (TID) | INTRAMUSCULAR | Status: DC | PRN

## 2018-08-24 MED ORDER — DOCUSATE SODIUM 100 MG PO CAPS
100.0000 mg | ORAL_CAPSULE | Freq: Two times a day (BID) | ORAL | Status: DC
  Administered 2018-08-24 – 2018-08-25 (×2): 100 mg via ORAL
  Filled 2018-08-24 (×2): qty 1

## 2018-08-24 MED ORDER — KETOROLAC TROMETHAMINE 30 MG/ML IJ SOLN
INTRAMUSCULAR | Status: DC | PRN
  Administered 2018-08-24: 30 mg

## 2018-08-24 MED ORDER — DIPHENHYDRAMINE HCL 12.5 MG/5ML PO ELIX: 12.5000 mg | ORAL_SOLUTION | ORAL | Status: DC | PRN

## 2018-08-24 MED ORDER — SODIUM CHLORIDE 0.9 % IV SOLN: INTRAVENOUS | Status: DC

## 2018-08-24 MED ORDER — EPHEDRINE SULFATE-NACL 50-0.9 MG/10ML-% IV SOSY
PREFILLED_SYRINGE | INTRAVENOUS | Status: DC | PRN
  Administered 2018-08-24 (×2): 5 mg via INTRAVENOUS

## 2018-08-24 MED ORDER — METHOCARBAMOL 500 MG IVPB - SIMPLE MED
INTRAVENOUS | Status: AC
  Administered 2018-08-24: 500 mg
  Filled 2018-08-24: qty 50

## 2018-08-24 MED ORDER — BUPIVACAINE IN DEXTROSE 0.75-8.25 % IT SOLN
INTRATHECAL | Status: DC | PRN
  Administered 2018-08-24: 2 mL via INTRATHECAL

## 2018-08-24 MED ORDER — FENTANYL CITRATE (PF) 100 MCG/2ML IJ SOLN
INTRAMUSCULAR | Status: DC | PRN
  Administered 2018-08-24: 50 ug via INTRAVENOUS

## 2018-08-24 MED ORDER — SENNA 8.6 MG PO TABS
1.0000 | ORAL_TABLET | Freq: Two times a day (BID) | ORAL | Status: DC
  Administered 2018-08-24 – 2018-08-25 (×2): 8.6 mg via ORAL
  Filled 2018-08-24 (×2): qty 1

## 2018-08-24 MED ORDER — MENTHOL 3 MG MT LOZG: 1.0000 | LOZENGE | OROMUCOSAL | Status: DC | PRN

## 2018-08-24 MED ORDER — KETOROLAC TROMETHAMINE 30 MG/ML IJ SOLN
INTRAMUSCULAR | Status: AC
  Filled 2018-08-24: qty 1

## 2018-08-24 MED ORDER — KETOROLAC TROMETHAMINE 15 MG/ML IJ SOLN
15.0000 mg | Freq: Four times a day (QID) | INTRAMUSCULAR | Status: AC
  Administered 2018-08-24 – 2018-08-25 (×4): 15 mg via INTRAVENOUS
  Filled 2018-08-24 (×4): qty 1

## 2018-08-24 MED ORDER — LACTATED RINGERS IV SOLN
Freq: Once | INTRAVENOUS | Status: AC
  Administered 2018-08-24 (×2): via INTRAVENOUS

## 2018-08-24 MED ORDER — METHOCARBAMOL 500 MG IVPB - SIMPLE MED
500.0000 mg | Freq: Four times a day (QID) | INTRAVENOUS | Status: DC | PRN
  Filled 2018-08-24: qty 50

## 2018-08-24 MED ORDER — POVIDONE-IODINE 10 % EX SWAB: 2.0000 "application " | Freq: Once | CUTANEOUS | Status: DC

## 2018-08-24 MED ORDER — ALUM & MAG HYDROXIDE-SIMETH 200-200-20 MG/5ML PO SUSP: 30.0000 mL | ORAL | Status: DC | PRN

## 2018-08-24 MED ORDER — BUPIVACAINE HCL (PF) 0.25 % IJ SOLN
INTRAMUSCULAR | Status: AC
  Filled 2018-08-24: qty 30

## 2018-08-24 MED ORDER — HYDROCODONE-ACETAMINOPHEN 7.5-325 MG PO TABS
1.0000 | ORAL_TABLET | ORAL | Status: DC | PRN
  Administered 2018-08-24 – 2018-08-25 (×4): 1 via ORAL
  Filled 2018-08-24 (×4): qty 1

## 2018-08-24 MED ORDER — ACETAMINOPHEN 10 MG/ML IV SOLN
INTRAVENOUS | Status: DC | PRN
  Administered 2018-08-24: 1000 mg via INTRAVENOUS

## 2018-08-24 MED ORDER — MORPHINE SULFATE (PF) 2 MG/ML IV SOLN: 0.5000 mg | INTRAVENOUS | Status: DC | PRN

## 2018-08-24 MED ORDER — ISOPROPYL ALCOHOL 70 % SOLN
Status: AC
  Filled 2018-08-24: qty 480

## 2018-08-24 MED ORDER — SODIUM CHLORIDE 0.9 % IJ SOLN
INTRAMUSCULAR | Status: DC | PRN
  Administered 2018-08-24: 30 mL

## 2018-08-24 MED ORDER — ISOPROPYL ALCOHOL 70 % SOLN
Status: DC | PRN
  Administered 2018-08-24: 1 via TOPICAL

## 2018-08-24 MED ORDER — CEFAZOLIN SODIUM-DEXTROSE 2-4 GM/100ML-% IV SOLN
2.0000 g | INTRAVENOUS | Status: AC
  Administered 2018-08-24: 2 g via INTRAVENOUS
  Filled 2018-08-24: qty 100

## 2018-08-24 MED ORDER — DEXAMETHASONE SODIUM PHOSPHATE 10 MG/ML IJ SOLN
INTRAMUSCULAR | Status: AC
  Filled 2018-08-24: qty 1

## 2018-08-24 MED ORDER — DEXAMETHASONE SODIUM PHOSPHATE 10 MG/ML IJ SOLN
INTRAMUSCULAR | Status: DC | PRN
  Administered 2018-08-24: 10 mg via INTRAVENOUS

## 2018-08-24 MED ORDER — PROPOFOL 10 MG/ML IV BOLUS
INTRAVENOUS | Status: AC
  Filled 2018-08-24: qty 60

## 2018-08-24 MED ORDER — CHLORHEXIDINE GLUCONATE 4 % EX LIQD: 60.0000 mL | Freq: Once | CUTANEOUS | Status: DC

## 2018-08-24 MED ORDER — METOCLOPRAMIDE HCL 5 MG PO TABS: 5.0000 mg | ORAL_TABLET | Freq: Three times a day (TID) | ORAL | Status: DC | PRN

## 2018-08-24 MED ORDER — METHOCARBAMOL 500 MG PO TABS: 500.0000 mg | ORAL_TABLET | Freq: Four times a day (QID) | ORAL | Status: DC | PRN

## 2018-08-24 MED ORDER — MIDAZOLAM HCL 5 MG/5ML IJ SOLN
INTRAMUSCULAR | Status: DC | PRN
  Administered 2018-08-24: 2 mg via INTRAVENOUS

## 2018-08-24 MED ORDER — ACETAMINOPHEN 10 MG/ML IV SOLN
INTRAVENOUS | Status: AC
  Filled 2018-08-24: qty 100

## 2018-08-24 MED ORDER — APIXABAN 2.5 MG PO TABS
2.5000 mg | ORAL_TABLET | Freq: Two times a day (BID) | ORAL | Status: DC
  Administered 2018-08-25: 2.5 mg via ORAL
  Filled 2018-08-24: qty 1

## 2018-08-24 MED ORDER — PROPOFOL 500 MG/50ML IV EMUL
INTRAVENOUS | Status: DC | PRN
  Administered 2018-08-24: 50 ug/kg/min via INTRAVENOUS

## 2018-08-24 MED ORDER — KETOROLAC TROMETHAMINE 15 MG/ML IJ SOLN
INTRAMUSCULAR | Status: AC
  Administered 2018-08-24: 15 mg
  Filled 2018-08-24: qty 1

## 2018-08-24 MED ORDER — ONDANSETRON HCL 4 MG PO TABS: 4.0000 mg | ORAL_TABLET | Freq: Four times a day (QID) | ORAL | Status: DC | PRN

## 2018-08-24 MED ORDER — ONDANSETRON HCL 4 MG/2ML IJ SOLN: 4.0000 mg | Freq: Four times a day (QID) | INTRAMUSCULAR | Status: DC | PRN

## 2018-08-24 MED ORDER — ONDANSETRON HCL 4 MG/2ML IJ SOLN
INTRAMUSCULAR | Status: AC
  Filled 2018-08-24: qty 2

## 2018-08-24 MED ORDER — FENTANYL CITRATE (PF) 100 MCG/2ML IJ SOLN
INTRAMUSCULAR | Status: AC
  Filled 2018-08-24: qty 2

## 2018-08-24 MED ORDER — SODIUM CHLORIDE 0.9 % IR SOLN
Status: DC | PRN
  Administered 2018-08-24: 1000 mL

## 2018-08-24 MED ORDER — PROPOFOL 10 MG/ML IV BOLUS
INTRAVENOUS | Status: AC
  Filled 2018-08-24: qty 20

## 2018-08-24 MED ORDER — BUPIVACAINE HCL (PF) 0.25 % IJ SOLN
INTRAMUSCULAR | Status: DC | PRN
  Administered 2018-08-24: 30 mL

## 2018-08-24 MED ORDER — POLYETHYLENE GLYCOL 3350 17 G PO PACK: 17.0000 g | PACK | Freq: Every day | ORAL | Status: DC | PRN

## 2018-08-24 MED ORDER — ACETAMINOPHEN 325 MG PO TABS: 325.0000 mg | ORAL_TABLET | Freq: Four times a day (QID) | ORAL | Status: DC | PRN

## 2018-08-24 MED ORDER — DEXAMETHASONE SODIUM PHOSPHATE 10 MG/ML IJ SOLN
10.0000 mg | Freq: Once | INTRAMUSCULAR | Status: AC
  Administered 2018-08-25: 10 mg via INTRAVENOUS
  Filled 2018-08-24: qty 1

## 2018-08-24 MED ORDER — PROPOFOL 10 MG/ML IV BOLUS
INTRAVENOUS | Status: DC | PRN
  Administered 2018-08-24 (×2): 20 mg via INTRAVENOUS

## 2018-08-24 MED ORDER — TRANEXAMIC ACID-NACL 1000-0.7 MG/100ML-% IV SOLN
1000.0000 mg | INTRAVENOUS | Status: AC
  Administered 2018-08-24: 1000 mg via INTRAVENOUS
  Filled 2018-08-24: qty 100

## 2018-08-24 MED ORDER — CEFAZOLIN SODIUM-DEXTROSE 2-4 GM/100ML-% IV SOLN
2.0000 g | Freq: Four times a day (QID) | INTRAVENOUS | Status: AC
  Administered 2018-08-24 (×2): 2 g via INTRAVENOUS
  Filled 2018-08-24 (×2): qty 100

## 2018-08-24 MED ORDER — SODIUM CHLORIDE 0.9 % IV SOLN
INTRAVENOUS | Status: DC
  Administered 2018-08-24 – 2018-08-25 (×3): via INTRAVENOUS

## 2018-08-24 MED ORDER — WATER FOR IRRIGATION, STERILE IR SOLN
Status: DC | PRN
  Administered 2018-08-24: 2000 mL

## 2018-08-24 SURGICAL SUPPLY — 48 items
BAG DECANTER FOR FLEXI CONT (MISCELLANEOUS) IMPLANT
BAG ZIPLOCK 12X15 (MISCELLANEOUS) IMPLANT
CHLORAPREP W/TINT 26ML (MISCELLANEOUS) ×3 IMPLANT
CLOTH BEACON ORANGE TIMEOUT ST (SAFETY) ×3 IMPLANT
COVER PERINEAL POST (MISCELLANEOUS) ×3 IMPLANT
COVER SURGICAL LIGHT HANDLE (MISCELLANEOUS) ×3 IMPLANT
COVER WAND RF STERILE (DRAPES) IMPLANT
CUP ACET PNNCL SECTR W/GRIP 56 (Hips) ×1 IMPLANT
DECANTER SPIKE VIAL GLASS SM (MISCELLANEOUS) ×3 IMPLANT
DERMABOND ADVANCED (GAUZE/BANDAGES/DRESSINGS) ×4
DERMABOND ADVANCED .7 DNX12 (GAUZE/BANDAGES/DRESSINGS) ×2 IMPLANT
DRAPE SHEET LG 3/4 BI-LAMINATE (DRAPES) ×9 IMPLANT
DRAPE STERI IOBAN 125X83 (DRAPES) ×3 IMPLANT
DRAPE U-SHAPE 47X51 STRL (DRAPES) ×6 IMPLANT
DRSG AQUACEL AG ADV 3.5X10 (GAUZE/BANDAGES/DRESSINGS) ×3 IMPLANT
ELECT PENCIL ROCKER SW 15FT (MISCELLANEOUS) ×3 IMPLANT
ELECT REM PT RETURN 15FT ADLT (MISCELLANEOUS) ×3 IMPLANT
GAUZE SPONGE 4X4 12PLY STRL (GAUZE/BANDAGES/DRESSINGS) ×3 IMPLANT
GLOVE BIO SURGEON STRL SZ8.5 (GLOVE) ×6 IMPLANT
GLOVE BIOGEL PI IND STRL 8.5 (GLOVE) ×1 IMPLANT
GLOVE BIOGEL PI INDICATOR 8.5 (GLOVE) ×2
GOWN SPEC L3 XXLG W/TWL (GOWN DISPOSABLE) ×3 IMPLANT
HANDPIECE INTERPULSE COAX TIP (DISPOSABLE) ×2
HEAD CERAMIC 36 PLUS5 (Hips) ×3 IMPLANT
HOLDER FOLEY CATH W/STRAP (MISCELLANEOUS) ×3 IMPLANT
HOOD PEEL AWAY FLYTE STAYCOOL (MISCELLANEOUS) ×12 IMPLANT
LINER NEUTRAL 52MMX36MMX56N (Liner) ×3 IMPLANT
MARKER SKIN DUAL TIP RULER LAB (MISCELLANEOUS) ×3 IMPLANT
NEEDLE SPNL 18GX3.5 QUINCKE PK (NEEDLE) ×3 IMPLANT
PACK ANTERIOR HIP CUSTOM (KITS) ×3 IMPLANT
PINN SECTOR W/GRIP ACE CUP 56 (Hips) ×3 IMPLANT
SAW OSC TIP CART 19.5X105X1.3 (SAW) ×3 IMPLANT
SEALER BIPOLAR AQUA 6.0 (INSTRUMENTS) ×3 IMPLANT
SET HNDPC FAN SPRY TIP SCT (DISPOSABLE) ×1 IMPLANT
STEM TRI LOC GRIPTION SZ 5 STD ×1 IMPLANT
SUT ETHIBOND NAB CT1 #1 30IN (SUTURE) ×6 IMPLANT
SUT MNCRL AB 3-0 PS2 18 (SUTURE) ×3 IMPLANT
SUT MON AB 2-0 CT1 36 (SUTURE) ×6 IMPLANT
SUT STRATAFIX PDO 1 14 VIOLET (SUTURE) ×2
SUT STRATFX PDO 1 14 VIOLET (SUTURE) ×1
SUT VIC AB 2-0 CT1 27 (SUTURE) ×2
SUT VIC AB 2-0 CT1 TAPERPNT 27 (SUTURE) ×1 IMPLANT
SUTURE STRATFX PDO 1 14 VIOLET (SUTURE) ×1 IMPLANT
SYR 50ML LL SCALE MARK (SYRINGE) ×3 IMPLANT
TRAY FOLEY MTR SLVR 16FR STAT (SET/KITS/TRAYS/PACK) IMPLANT
TRI LOC GRIPTION SZ 5 STD ×3 IMPLANT
WATER STERILE IRR 1000ML POUR (IV SOLUTION) ×3 IMPLANT
YANKAUER SUCT BULB TIP 10FT TU (MISCELLANEOUS) ×3 IMPLANT

## 2018-08-24 NOTE — Evaluation (Signed)
Physical Therapy Evaluation Patient Details Name: John Guzman MRN: [id number] DOB: 09-25-1961 Today's Date: 08/24/2018   History of Present Illness  Pt s/p R THR and with hx of back surgery, and R ankle surgery  Clinical Impression  Pt s/p R THR and presents with decreased R LE strength/ROM and post op pain limiting functional mobility.  Pt should progress to dc home with family assist.    Follow Up Recommendations Follow surgeon's recommendation for DC plan and follow-up therapies    Equipment Recommendations  None recommended by PT    Recommendations for Other Services       Precautions / Restrictions Precautions Precautions: Fall Restrictions Weight Bearing Restrictions: No Other Position/Activity Restrictions: WBAT      Mobility  Bed Mobility Overal bed mobility: Needs Assistance Bed Mobility: Supine to Sit     Supine to sit: Min guard     General bed mobility comments: cues for sequence and use of L LE to self assist  Transfers Overall transfer level: Needs assistance   Transfers: Sit to/from Stand Sit to Stand: Min assist;Min guard         General transfer comment: cues for LE management and use of UEs to self assist  Ambulation/Gait Ambulation/Gait assistance: Min assist;Min guard Gait Distance (Feet): 147 Feet Assistive device: Rolling walker (2 wheeled) Gait Pattern/deviations: Step-to pattern;Step-through pattern;Decreased step length - right;Decreased step length - left;Shuffle;Trunk flexed Gait velocity: decr   General Gait Details: cues for posture, position from RW and initial sequence  Stairs            Wheelchair Mobility    Modified Rankin (Stroke Patients Only)       Balance Overall balance assessment: Mild deficits observed, not formally tested                                           Pertinent Vitals/Pain Pain Assessment: 0-10 Pain Score: 4  Pain Location: R hip Pain Descriptors / Indicators:  Aching;Sore Pain Intervention(s): Limited activity within patient's tolerance;Monitored during session;Premedicated before session;Ice applied    Home Living Family/patient expects to be discharged to:: Private residence Living Arrangements: Spouse/significant other Available Help at Discharge: Family Type of Home: House Home Access: Stairs to enter Entrance Stairs-Rails: None Secretary/administrator of Steps: 2 Home Layout: One level Home Equipment: Environmental consultant - 2 wheels      Prior Function Level of Independence: Independent               Hand Dominance        Extremity/Trunk Assessment   Upper Extremity Assessment Upper Extremity Assessment: Overall WFL for tasks assessed    Lower Extremity Assessment Lower Extremity Assessment: RLE deficits/detail    Cervical / Trunk Assessment Cervical / Trunk Assessment: Normal  Communication   Communication: No difficulties  Cognition Arousal/Alertness: Awake/alert Behavior During Therapy: WFL for tasks assessed/performed Overall Cognitive Status: Within Functional Limits for tasks assessed                                        General Comments      Exercises Total Joint Exercises Ankle Circles/Pumps: AROM;Both;15 reps;Supine   Assessment/Plan    PT Assessment Patient needs continued PT services  PT Problem List Decreased strength;Decreased range of motion;Decreased activity tolerance;Decreased mobility;Decreased knowledge of  use of DME;Pain       PT Treatment Interventions DME instruction;Stair training;Gait training;Functional mobility training;Therapeutic activities;Therapeutic exercise;Patient/family education    PT Goals (Current goals can be found in the Care Plan section)  Acute Rehab PT Goals Patient Stated Goal: Regain IND PT Goal Formulation: With patient Time For Goal Achievement: 08/31/18 Potential to Achieve Goals: Good    Frequency 7X/week   Barriers to discharge         Co-evaluation               AM-PAC PT "6 Clicks" Daily Activity  Outcome Measure Difficulty turning over in bed (including adjusting bedclothes, sheets and blankets)?: Unable Difficulty moving from lying on back to sitting on the side of the bed? : Unable Difficulty sitting down on and standing up from a chair with arms (e.g., wheelchair, bedside commode, etc,.)?: Unable Help needed moving to and from a bed to chair (including a wheelchair)?: A Little Help needed walking in hospital room?: A Little Help needed climbing 3-5 steps with a railing? : A Little 6 Click Score: 12    End of Session Equipment Utilized During Treatment: Gait belt Activity Tolerance: Patient tolerated treatment well Patient left: in chair;with call bell/phone within reach;with family/visitor present Nurse Communication: Mobility status PT Visit Diagnosis: Difficulty in walking, not elsewhere classified (R26.2)    Time: 1610-9604 PT Time Calculation (min) (ACUTE ONLY): 35 min   Charges:   PT Evaluation $PT Eval Low Complexity: 1 Low PT Treatments $Gait Training: 8-22 mins        Mauro Kaufmann PT Acute Rehabilitation Services Pager [phone] Office [phone]   Kirsta Probert 08/24/2018, 3:43 PM

## 2018-08-24 NOTE — Transfer of Care (Signed)
Immediate Anesthesia Transfer of Care Note  Patient: John Guzman  Procedure(s) Performed: RIGHT TOTAL HIP ARTHROPLASTY ANTERIOR APPROACH (Right Hip)  Patient Location: PACU  Anesthesia Type:MAC and Spinal  Level of Consciousness: awake, alert , oriented and patient cooperative  Airway & Oxygen Therapy: Patient Spontanous Breathing and Patient connected to face mask oxygen  Post-op Assessment: Report given to RN and Post -op Vital signs reviewed and stable  Post vital signs: Reviewed and stable  Last Vitals:  Vitals Value Taken Time  BP    Temp    Pulse 71 08/24/2018 10:54 AM  Resp 15 08/24/2018 10:54 AM  SpO2 100 % 08/24/2018 10:54 AM  Vitals shown include unvalidated device data.  Last Pain:  Vitals:   08/24/18 0651  TempSrc: Oral  PainSc:       Patients Stated Pain Goal: 4 (83/81/84 0375)  Complications: No apparent anesthesia complications

## 2018-08-24 NOTE — Progress Notes (Signed)
PT Cancellation Note  Patient Details Name: John Guzman MRN: [id number] DOB: [birth year]   Cancelled Treatment:    Reason Eval/Treat Not Completed: Medical issues which prohibited therapy(spinal hasn't worn off yet. Will check back later today. )   Tamala Ser PT 08/24/2018  Acute Rehabilitation Services Pager [phone] Office [phone]

## 2018-08-24 NOTE — Discharge Instructions (Signed)
°Dr. Kahliya Fraleigh °Joint Replacement Specialist °[facility] °[address] °[place], [place] [address] °[phone] ° ° °TOTAL HIP REPLACEMENT POSTOPERATIVE DIRECTIONS ° ° ° °Hip Rehabilitation, Guidelines Following Surgery  ° °WEIGHT BEARING °Weight bearing as tolerated with assist device (walker, cane, etc) as directed, use it as long as suggested by your surgeon or therapist, typically at least 4-6 weeks. ° °The results of a hip operation are greatly improved after range of motion and muscle strengthening exercises. Follow all safety measures which are given to protect your hip. If any of these exercises cause increased pain or swelling in your joint, decrease the amount until you are comfortable again. Then slowly increase the exercises. Call your caregiver if you have problems or questions.  ° °HOME CARE INSTRUCTIONS  °Most of the following instructions are designed to prevent the dislocation of your new hip.  °Remove items at home which could result in a fall. This includes throw rugs or furniture in walking pathways.  °Continue medications as instructed at time of discharge. °· You may have some home medications which will be placed on hold until you complete the course of blood thinner medication. °· You may start showering once you are discharged home. Do not remove your dressing. °Do not put on socks or shoes without following the instructions of your caregivers.   °Sit on chairs with arms. Use the chair arms to help push yourself up when arising.  °Arrange for the use of a toilet seat elevator so you are not sitting low.  °· Walk with walker as instructed.  °You may resume a sexual relationship in one month or when given the OK by your caregiver.  °Use walker as long as suggested by your caregivers.  °You may put full weight on your legs and walk as much as is comfortable. °Avoid periods of inactivity such as sitting longer than an hour when not asleep. This helps prevent  blood clots.  °You may return to work once you are cleared by your surgeon.  °Do not drive a car for 6 weeks or until released by your surgeon.  °Do not drive while taking narcotics.  °Wear elastic stockings for two weeks following surgery during the day but you may remove then at night.  °Make sure you keep all of your appointments after your operation with all of your doctors and caregivers. You should call the office at the above phone number and make an appointment for approximately two weeks after the date of your surgery. °Please pick up a stool softener and laxative for home use as long as you are requiring pain medications. °· ICE to the affected hip every three hours for 30 minutes at a time and then as needed for pain and swelling. Continue to use ice on the hip for pain and swelling from surgery. You may notice swelling that will progress down to the foot and ankle.  This is normal after surgery.  Elevate the leg when you are not up walking on it.   °It is important for you to complete the blood thinner medication as prescribed by your doctor. °· Continue to use the breathing machine which will help keep your temperature down.  It is common for your temperature to cycle up and down following surgery, especially at night when you are not up moving around and exerting yourself.  The breathing machine keeps your lungs expanded and your temperature down. ° °RANGE OF MOTION AND STRENGTHENING EXERCISES  °These exercises are   designed to help you keep full movement of your hip joint. Follow your caregiver's or physical therapist's instructions. Perform all exercises about fifteen times, three times per day or as directed. Exercise both hips, even if you have had only one joint replacement. These exercises can be done on a training (exercise) mat, on the floor, on a table or on a bed. Use whatever works the best and is most comfortable for you. Use music or television while you are exercising so that the exercises  are a pleasant break in your day. This will make your life better with the exercises acting as a break in routine you can look forward to.  °Lying on your back, slowly slide your foot toward your buttocks, raising your knee up off the floor. Then slowly slide your foot back down until your leg is straight again.  °Lying on your back spread your legs as far apart as you can without causing discomfort.  °Lying on your side, raise your upper leg and foot straight up from the floor as far as is comfortable. Slowly lower the leg and repeat.  °Lying on your back, tighten up the muscle in the front of your thigh (quadriceps muscles). You can do this by keeping your leg straight and trying to raise your heel off the floor. This helps strengthen the largest muscle supporting your knee.  °Lying on your back, tighten up the muscles of your buttocks both with the legs straight and with the knee bent at a comfortable angle while keeping your heel on the floor.  ° °SKILLED REHAB INSTRUCTIONS: °If the patient is transferred to a skilled rehab facility following release from the hospital, a list of the current medications will be sent to the facility for the patient to continue.  When discharged from the skilled rehab facility, please have the facility set up the patient's Home Health Physical Therapy prior to being released. Also, the skilled facility will be responsible for providing the patient with their medications at time of release from the facility to include their pain medication and their blood thinner medication. If the patient is still at the rehab facility at time of the two week follow up appointment, the skilled rehab facility will also need to assist the patient in arranging follow up appointment in our office and any transportation needs. ° °MAKE SURE YOU:  °Understand these instructions.  °Will watch your condition.  °Will get help right away if you are not doing well or get worse. ° °Pick up stool softner and  laxative for home use following surgery while on pain medications. °Do not remove your dressing. °The dressing is waterproof--it is OK to take showers. °Continue to use ice for pain and swelling after surgery. °Do not use any lotions or creams on the incision until instructed by your surgeon. °Total Hip Protocol. ° ° °

## 2018-08-24 NOTE — Anesthesia Preprocedure Evaluation (Signed)
Anesthesia Evaluation  Patient identified by MRN, date of birth, ID band Patient awake    Reviewed: Allergy & Precautions, NPO status , Patient's Chart, lab work & pertinent test results  Airway Mallampati: II  TM Distance: >3 FB Neck ROM: Full    Dental no notable dental hx.    Pulmonary neg pulmonary ROS,    Pulmonary exam normal breath sounds clear to auscultation       Cardiovascular negative cardio ROS Normal cardiovascular exam Rhythm:Regular Rate:Normal     Neuro/Psych negative neurological ROS  negative psych ROS   GI/Hepatic negative GI ROS, Neg liver ROS,   Endo/Other  negative endocrine ROS  Renal/GU negative Renal ROS  negative genitourinary   Musculoskeletal  (+) Arthritis ,   Abdominal   Peds negative pediatric ROS (+)  Hematology negative hematology ROS (+)   Anesthesia Other Findings   Reproductive/Obstetrics negative OB ROS                             Anesthesia Physical Anesthesia Plan  ASA: II  Anesthesia Plan: Spinal   Post-op Pain Management:    Induction: Intravenous  PONV Risk Score and Plan: Ondansetron  Airway Management Planned: Simple Face Mask  Additional Equipment:   Intra-op Plan:   Post-operative Plan:   Informed Consent: I have reviewed the patients History and Physical, chart, labs and discussed the procedure including the risks, benefits and alternatives for the proposed anesthesia with the patient or authorized representative who has indicated his/her understanding and acceptance.   Dental advisory given  Plan Discussed with: CRNA and Surgeon  Anesthesia Plan Comments:         Anesthesia Quick Evaluation

## 2018-08-24 NOTE — Anesthesia Postprocedure Evaluation (Signed)
Anesthesia Post Note  Patient: MCARTHUR IVINS  Procedure(s) Performed: RIGHT TOTAL HIP ARTHROPLASTY ANTERIOR APPROACH (Right Hip)     Patient location during evaluation: PACU Anesthesia Type: Spinal Level of consciousness: oriented and awake and alert Pain management: pain level controlled Vital Signs Assessment: post-procedure vital signs reviewed and stable Respiratory status: spontaneous breathing, respiratory function stable and patient connected to nasal cannula oxygen Cardiovascular status: blood pressure returned to baseline and stable Postop Assessment: no headache, no backache and no apparent nausea or vomiting Anesthetic complications: no    Last Vitals:  Vitals:   08/24/18 1145 08/24/18 1200  BP: 103/67 108/69  Pulse: (!) 50 (!) 52  Resp: 14 15  Temp:  (!) 36.4 C  SpO2: 100% 100%    Last Pain:  Vitals:   08/24/18 1200  TempSrc:   PainSc: 0-No pain                 Noah Lembke S

## 2018-08-24 NOTE — Anesthesia Procedure Notes (Signed)
Spinal  Patient location during procedure: OR Start time: 08/24/2018 8:41 AM End time: 08/24/2018 8:46 AM Staffing Anesthesiologist: Eilene Ghazi, MD Resident/CRNA: Epimenio Sarin, CRNA Performed: resident/CRNA  Preanesthetic Checklist Completed: patient identified, surgical consent, pre-op evaluation, timeout performed, IV checked, risks and benefits discussed and monitors and equipment checked Spinal Block Patient position: sitting Prep: DuraPrep Patient monitoring: heart rate, cardiac monitor, continuous pulse ox and blood pressure Approach: midline Location: L3-4 Injection technique: single-shot Needle Needle type: Pencan  Needle gauge: 24 G Needle length: 10 cm Needle insertion depth: 7.5 cm Assessment Sensory level: T6

## 2018-08-24 NOTE — Op Note (Signed)
OPERATIVE REPORT  SURGEON: Samson Frederic, MD   ASSISTANT: Skip Mayer, PA-C.  PREOPERATIVE DIAGNOSIS: Right hip avascular necrosis.   POSTOPERATIVE DIAGNOSIS: Right hip avascular necrosis.   PROCEDURE: Right total hip arthroplasty, anterior approach.   IMPLANTS: DePuy Tri Lock stem, size 5, std offset. DePuy Pinnacle Cup, size 56 mm. DePuy Altrx liner, size 36 by 56 mm, neutral. DePuy Biolox ceramic head ball, size 36 + 5 mm.  ANESTHESIA:  Spinal  ESTIMATED BLOOD LOSS:-300 mL    ANTIBIOTICS: 2 g Ancef.  DRAINS: None.  COMPLICATIONS: None.   CONDITION: PACU - hemodynamically stable.   BRIEF CLINICAL NOTE: John Guzman is a 57 y.o. male with a long-standing history of Right hip avascular necrosis. After failing conservative management, the patient was indicated for total hip arthroplasty. The risks, benefits, and alternatives to the procedure were explained, and the patient elected to proceed.  PROCEDURE IN DETAIL: Surgical site was marked by myself in the pre-op holding area. Once inside the operating room, spinal anesthesia was obtained, and a foley catheter was inserted. The patient was then positioned on the Hana table. All bony prominences were well padded. The hip was prepped and draped in the normal sterile surgical fashion. A time-out was called verifying side and site of surgery. The patient received IV antibiotics within 60 minutes of beginning the procedure.  The direct anterior approach to the hip was performed through the Hueter interval. Lateral femoral circumflex vessels were treated with the Auqumantys. The anterior capsule was exposed and an inverted T capsulotomy was made.The femoral neck cut was made to the level of the templated cut. A corkscrew was placed into the head and the head was removed. The head was passed to the back table and was measured.  Acetabular exposure was achieved, and the pulvinar and labrum were excised. Sequential  reaming of the acetabulum was then performed up to a size 55 mm reamer. A 56 mm cup was then opened and impacted into place at approximately 40 degrees of abduction and 20 degrees of anteversion. The final polyethylene liner was impacted into place and acetabular osteophytes were removed.   I then gained femoral exposure taking care to protect the abductors and greater trochanter. This was performed using standard external rotation, extension, and adduction. The capsule was peeled off the inner aspect of the greater trochanter, taking care to preserve the short external rotators. A cookie cutter was used to enter the femoral canal, and then the femoral canal finder was placed. Sequential broaching was performed up to a size 5. Calcar planer was used on the femoral neck remnant. I placed a std offset neck and a trial head ball. The hip was reduced. Leg lengths and offset were checked fluoroscopically. The hip was dislocated and trial components were removed. The final implants were placed, and the hip was reduced.  Fluoroscopy was used to confirm component position and leg lengths. At 90 degrees of external rotation and full extension, the hip was stable to an anterior directed force.  The wound was copiously irrigated with normal saline using pulse lavage. Marcaine solution was injected into the periarticular soft tissue. The wound was closed in layers using #1 Vicryl and V-Loc for the fascia, 2-0 Vicryl for the subcutaneous fat, 2-0 Monocryl for the deep dermal layer, 3-0 running Monocryl subcuticular stitch, and Dermabond for the skin. Once the glue was fully dried, an Aquacell Ag dressing was applied. The patient was transported to the recovery room in stable condition. Sponge, needle, and  instrument counts were correct at the end of the case x2. The patient tolerated the procedure well and there were no known complications.  Please note that a surgical assistant was a medical necessity  for this procedure to perform it in a safe and expeditious manner. Assistant was necessary to provide appropriate retraction of vital neurovascular structures, to prevent femoral fracture, and to allow for anatomic placement of the prosthesis.

## 2018-08-24 NOTE — Interval H&P Note (Signed)
History and Physical Interval Note:  08/24/2018 8:28 AM  John Guzman  has presented today for surgery, with the diagnosis of Avascular necrosis right hip  The various methods of treatment have been discussed with the patient and family. After consideration of risks, benefits and other options for treatment, the patient has consented to  Procedure(s) with comments: RIGHT TOTAL HIP ARTHROPLASTY ANTERIOR APPROACH (Right) - Needs RNFA as a surgical intervention .  The patient's history has been reviewed, patient examined, no change in status, stable for surgery.  I have reviewed the patient's chart and labs.  Questions were answered to the patient's satisfaction.    The risks, benefits, and alternatives were discussed with the patient. There are risks associated with the surgery including, but not limited to, problems with anesthesia (death), infection, instability (giving out of the joint), dislocation, differences in leg length/angulation/rotation, fracture of bones, loosening or failure of implants, hematoma (blood accumulation) which may require surgical drainage, blood clots, pulmonary embolism, nerve injury (foot drop and lateral thigh numbness), and blood vessel injury. The patient understands these risks and elects to proceed.   Iline Oven Kourtnie Sachs

## 2018-08-25 ENCOUNTER — Encounter (HOSPITAL_COMMUNITY): Payer: Self-pay | Admitting: Orthopedic Surgery

## 2018-08-25 LAB — BASIC METABOLIC PANEL
Anion gap: 7 (ref 5–15)
BUN: 12 mg/dL (ref 6–20)
CALCIUM: 8.6 mg/dL — AB (ref 8.9–10.3)
CO2: 26 mmol/L (ref 22–32)
CREATININE: 0.75 mg/dL (ref 0.61–1.24)
Chloride: 104 mmol/L (ref 98–111)
GFR calc Af Amer: >60 mL/min (ref >60)
GFR calc non Af Amer: >60 mL/min (ref >60)
Glucose, Bld: 151 mg/dL — ABNORMAL HIGH (ref 70–99)
Potassium: 4.7 mmol/L (ref 3.5–5.1)
Sodium: 137 mmol/L (ref 135–145)

## 2018-08-25 LAB — CBC
HEMATOCRIT: 41.5 % (ref 39.0–52.0)
Hemoglobin: 13.3 g/dL (ref 13.0–17.0)
MCH: 28.9 pg (ref 26.0–34.0)
MCHC: 32 g/dL (ref 30.0–36.0)
MCV: 90.2 fL (ref 80.0–100.0)
PLATELETS: 260 10*3/uL (ref 150–400)
RBC: 4.6 MIL/uL (ref 4.22–5.81)
RDW: 12.5 % (ref 11.5–15.5)
WBC: 12.5 10*3/uL — ABNORMAL HIGH (ref 4.0–10.5)
nRBC: 0 % (ref 0.0–0.2)

## 2018-08-25 MED ORDER — ONDANSETRON HCL 4 MG PO TABS: 4.0000 mg | ORAL_TABLET | Freq: Four times a day (QID) | ORAL | 0 refills | Status: DC | PRN

## 2018-08-25 MED ORDER — HYDROCODONE-ACETAMINOPHEN 5-325 MG PO TABS: 1.0000 | ORAL_TABLET | Freq: Four times a day (QID) | ORAL | 0 refills | Status: DC | PRN

## 2018-08-25 MED ORDER — DOCUSATE SODIUM 100 MG PO CAPS: 100.0000 mg | ORAL_CAPSULE | Freq: Two times a day (BID) | ORAL | 1 refills | Status: DC

## 2018-08-25 MED ORDER — SENNA 8.6 MG PO TABS: 1.0000 | ORAL_TABLET | Freq: Two times a day (BID) | ORAL | 0 refills | Status: DC

## 2018-08-25 NOTE — Progress Notes (Signed)
    Subjective:  Patient reports pain as mild to moderate.  Denies N/V/CP/SOB. No c/o.  Objective:   VITALS:   Vitals:   08/24/18 1751 08/24/18 2139 08/25/18 0125 08/25/18 0502  BP: 123/77 123/74 111/80 125/79  Pulse: 72 61 (!) 58 72  Resp: 16 16 14 15   Temp: 98.2 F (36.8 C) 98.4 F (36.9 C) 98.2 F (36.8 C) 98 F (36.7 C)  TempSrc: Oral Oral Oral Oral  SpO2: 96% 95% 96% 98%  Weight:      Height:        NAD ABD soft Sensation intact distally Intact pulses distally Dorsiflexion/Plantar flexion intact Incision: dressing C/D/I Compartment soft   Lab Results  Component Value Date   WBC 12.5 (H) 08/25/2018   HGB 13.3 08/25/2018   HCT 41.5 08/25/2018   MCV 90.2 08/25/2018   PLT 260 08/25/2018   BMET    Component Value Date/Time   NA 137 08/25/2018 0546   K 4.7 08/25/2018 0546   CL 104 08/25/2018 0546   CO2 26 08/25/2018 0546   GLUCOSE 151 (H) 08/25/2018 0546   BUN 12 08/25/2018 0546   CREATININE 0.75 08/25/2018 0546   CALCIUM 8.6 (L) 08/25/2018 0546   GFRNONAA >60 08/25/2018 0546   GFRAA >60 08/25/2018 0546    Patient's anticipated LOS is less than 2 midnights, meeting these requirements: - Younger than 44 - Lives within 1 hour of care - Has a competent adult at home to recover with post-op recover - NO history of  - Chronic pain requiring opiods  - Diabetes  - Coronary Artery Disease  - Heart failure  - Heart attack  - Stroke  - DVT/VTE  - Cardiac arrhythmia  - Respiratory Failure/COPD  - Renal failure  - Anemia  - Advanced Liver disease      Assessment/Plan: 1 Day Post-Op   Principal Problem:   Avascular necrosis of femoral head, right (HCC) Active Problems:   Avascular necrosis of hip, right (HCC)   WBAT with walker DVT ppx: apixaban, SCDs, TEDS PO pain control PT/OT Dispo: D/C home with HEP   Iline Oven Jayshaun Phillips 08/25/2018, 9:18 AM   Samson Frederic, MD Cell: [phone] [facility] is now [facility]   [place] [address], [address], [place], [place] [address] Phone: [phone] [email or web address] Facebook  [employer]

## 2018-08-25 NOTE — Discharge Summary (Signed)
Physician Discharge Summary  Patient ID: John Guzman MRN: [id number] DOB/AGE: May 28, 1961 57 y.o.  Admit date: 08/24/2018 Discharge date: 08/25/2018  Admission Diagnoses:  Avascular necrosis of femoral head, right [facility])  Discharge Diagnoses:  Principal Problem:   Avascular necrosis of femoral head, right (HCC) Active Problems:   Avascular necrosis of hip, right [facility])   Past Medical History:  Diagnosis Date  . DVT of lower extremity (deep venous thrombosis) (HCC) 09/2017   Right Lower Extremity  . Elevated cholesterol   . Hx of pulmonary embolus 09/2017    Surgeries: Procedure(s): RIGHT TOTAL HIP ARTHROPLASTY ANTERIOR APPROACH on 08/24/2018   Consultants (if any):   Discharged Condition: Improved  Hospital Course: John Guzman is an 57 y.o. male who was admitted 08/24/2018 with a diagnosis of Avascular necrosis of femoral head, right (HCC) and went to the operating room on 08/24/2018 and underwent the above named procedures.    He was given perioperative antibiotics:  Anti-infectives (From admission, onward)   Start     Dose/Rate Route Frequency Ordered Stop   08/24/18 1430  ceFAZolin (ANCEF) IVPB 2g/100 mL premix     2 g 200 mL/hr over 30 Minutes Intravenous Every 6 hours 08/24/18 1224 08/24/18 2142   08/24/18 0645  ceFAZolin (ANCEF) IVPB 2g/100 mL premix     2 g 200 mL/hr over 30 Minutes Intravenous On call to O.R. 08/24/18 7829 08/24/18 0850    .  He was given sequential compression devices, early ambulation, and apixaban for DVT prophylaxis.  He benefited maximally from the hospital stay and there were no complications.    Recent vital signs:  Vitals:   08/25/18 0125 08/25/18 0502  BP: 111/80 125/79  Pulse: (!) 58 72  Resp: 14 15  Temp: 98.2 F (36.8 C) 98 F (36.7 C)  SpO2: 96% 98%    Recent laboratory studies:  Lab Results  Component Value Date   HGB 13.3 08/25/2018   HGB 15.1 08/17/2018   Lab Results  Component Value Date   WBC 12.5 (H)  08/25/2018   PLT 260 08/25/2018   Lab Results  Component Value Date   INR 1.00 08/17/2018   Lab Results  Component Value Date   NA 137 08/25/2018   K 4.7 08/25/2018   CL 104 08/25/2018   CO2 26 08/25/2018   BUN 12 08/25/2018   CREATININE 0.75 08/25/2018   GLUCOSE 151 (H) 08/25/2018    Discharge Medications:   Allergies as of 08/25/2018   No Known Allergies     Medication List    STOP taking these medications   acetaminophen 500 MG tablet Commonly known as:  TYLENOL     TAKE these medications   docusate sodium 100 MG capsule Commonly known as:  COLACE Take 1 capsule (100 mg total) by mouth 2 (two) times daily.   ELIQUIS 5 MG Tabs tablet Generic drug:  apixaban Take 5 mg by mouth 2 (two) times daily.   HYDROcodone-acetaminophen 5-325 MG tablet Commonly known as:  NORCO/VICODIN Take 1-2 tablets by mouth every 6 (six) hours as needed for moderate pain.   ondansetron 4 MG tablet Commonly known as:  ZOFRAN Take 1 tablet (4 mg total) by mouth every 6 (six) hours as needed for nausea.   senna 8.6 MG Tabs tablet Commonly known as:  SENOKOT Take 1 tablet (8.6 mg total) by mouth 2 (two) times daily.       Diagnostic Studies: Ir Ivc Filter Plmt / S&i /img Guid/mod Sed  Result Date: 08/17/2018 CLINICAL DATA:  57 year old male with a history of DVT, on anticoagulation, with a pending surgery. He will be withdrawn from anticoagulation. He presents today for retrievable IVC filter placement. EXAM: 1. ULTRASOUND GUIDANCE FOR VASCULAR ACCESS OF THE RIGHT jugular VEIN. 2. IVC VENOGRAM. 3. PERCUTANEOUS IVC FILTER PLACEMENT. ANESTHESIA/SEDATION: 2.0 mg IV Versed; 75 mcg IV Fentanyl. Total Moderate Sedation Time Thirty-oneminutes. CONTRAST:  40 cc FLUOROSCOPY TIME:  8 minutes 18 seconds . PROCEDURE: The procedure, risks, benefits, and alternatives were explained to the patient. Specific risks discussed include bleeding, infection, contrast reaction, renal failure, IVC filter  fracture, migration, ileo caval thrombus (3% incidence), need for further procedure, need for further surgery, pulmonary embolism, cardiopulmonary collapse, death. Questions regarding the procedure were encouraged and answered. The patient understands and consents to the procedure. Ultrasound survey was performed with images stored and sent to PACs. The neck was prepped with Betadine in a sterile fashion, and a sterile drape was applied covering the operative field. A sterile gown and sterile gloves were used for the procedure. Local anesthesia was provided with 1% Lidocaine. A micropuncture needle was used access the right internal jugular vein under ultrasound. With excellent venous blood flow returned, and an .018 micro wire was passed through the needle. Small incision was made with an 11 blade scalpel. The needle was removed, and a micropuncture sheath was placed over the wire. The inner dilator and wire were removed, and an 035 Bentson wire was advanced under fluoroscopy into the IVC. Serial dilation of the soft tissue tract was performed with an 8 [place] dilator and subsequently a 10 [place] dilator. The delivery sheath for a retrievable Bard Denali filter was passed over the Bentson wire into the IVC. The wire was removed and small contrast was used to confirm IVC location. IVC cavagram performed. Dilator was removed, and the IVC filter was then delivered to the distal end of the delivery catheter. Upon initial pin pole release, the legs of the filter did not open, and the [place] attempted hree she thing was performed. At this point, the filter released from the delivery rod. The filter remained partially within the delivery catheter at this time. The delivery rod was removed and we advanced a 15 mm snare in attempt to snare the top end of the filter for a removal/stabilization. When the snare was deployed on the top into the filter the filter was pushed from the distal aspect of the catheter and did partially  release. Angiogram was performed. We determined that the initial placement was low and needed to be retrieved and replaced. The catheter was then snared with a 35 mm loop snare and removed. Given the malfunction of the initial IVC filter, the initial IVC filter was disposed and returned to the manufacturer. We then delivered a second IVC filter which deployed uneventfully, below the lowest renal vein. Final angiogram was performed. Manual pressure was used for hemostasis. Patient tolerated the procedure well and remained hemodynamically stable throughout. No complications were encountered and no significant blood loss was encounter. COMPLICATIONS: None. FINDINGS: IVC venography demonstrates a normal caliber IVC with no evidence of thrombus. Renal veins are identified bilaterally. The IVC filter was successfully positioned below the level of the renal veins and is appropriately oriented. This IVC filter has both permanent and retrievable indications. IMPRESSION: Status post placement of retrievable IVC filter. Signed, Yvone Neu. Reyne Dumas, RPVI [facility] Radiology PLAN: This IVC filter is retrievable. The patient will be assessed  for filter retrieval by Interventional Radiology in the intervention radiology clinic after the patient may be safely restarted on anticoagulation. Further recommendations regarding filter retrieval, continued surveillance or declaration of device permanence, will be made at that time. Electronically Signed   By: Gilmer Mor D.O.   On: 08/17/2018 11:20   Dg Pelvis Portable  Result Date: 08/24/2018 CLINICAL DATA:  Total hip replacement. EXAM: PORTABLE PELVIS 1-2 VIEWS COMPARISON:  08/24/2018. FINDINGS: Total right hip replacement. Hardware intact. Anatomic alignment. Diffuse osteopenia and degenerative change. IMPRESSION: Total right hip replacement. Anatomic alignment. No acute bony abnormality. Electronically Signed   By: Maisie Fus   Register   On: 08/24/2018 11:27   Dg C-arm 1-60 Min-no Report  Result Date: 08/24/2018 Fluoroscopy was utilized by the requesting physician.  No radiographic interpretation.   Dg Hip Operative Unilat W Or W/o Pelvis Right  Result Date: 08/24/2018 CLINICAL DATA:  Right total hip replacement. EXAM: OPERATIVE right HIP (WITH PELVIS IF PERFORMED) 2 VIEWS TECHNIQUE: Fluoroscopic spot image(s) were submitted for interpretation post-operatively. Radiation exposure index: 1.2849 mGy. COMPARISON:  None. FINDINGS: Two intraoperative fluoroscopic images demonstrate the patient be status post right total hip arthroplasty. The femoral and acetabular components appear to be well situated. IMPRESSION: Status post right total hip arthroplasty. Electronically Signed   By: Lupita Raider, M.D.   On: 08/24/2018 10:53   Ir Radiologist Eval & Mgmt  Result Date: 08/04/2018 Please refer to notes tab for details about interventional procedure. (Op Note)   Disposition: Discharge disposition: 01-Home or Self Care       Discharge Instructions    Call MD / Call 911   Complete by:  As directed    If you experience chest pain or shortness of breath, CALL 911 and be transported to the hospital emergency room.  If you develope a fever above 101 F, pus (white drainage) or increased drainage or redness at the wound, or calf pain, call your surgeon's office.   Constipation Prevention   Complete by:  As directed    Drink plenty of fluids.  Prune juice may be helpful.  You may use a stool softener, such as Colace (over the counter) 100 mg twice a day.  Use MiraLax (over the counter) for constipation as needed.   Diet - low sodium heart healthy   Complete by:  As directed    Driving restrictions   Complete by:  As directed    No driving for 6 weeks   Increase activity slowly as tolerated   Complete by:  As directed    Lifting restrictions   Complete by:  As directed    No lifting for 6 weeks   TED hose   Complete  by:  As directed    Use stockings (TED hose) for 2 weeks on both leg(s).  You may remove them at night for sleeping.      Follow-up Information    Lucille Witts, Arlys John, MD. Schedule an appointment as soon as possible for a visit in 2 weeks.   Specialty:  Orthopedic Surgery Why:  For wound re-check Contact information: [address] [place] [address] [phone]            Signed: Iline Oven Mechele Kittleson 08/25/2018, 9:20 AM

## 2018-08-25 NOTE — Progress Notes (Signed)
Physical Therapy Treatment Patient Details Name: John Guzman MRN: [id number] DOB: 1960-12-25 Today's Date: 08/25/2018    History of Present Illness Pt s/p R THR and with hx of back surgery, and R ankle surgery    PT Comments    Pt progressing well with mobility and eager for dc home.  Spouse present and reviewed car transfers, stairs and home therex program with progression and written instruction provided.  Follow Up Recommendations  Follow surgeon's recommendation for DC plan and follow-up therapies     Equipment Recommendations  None recommended by PT    Recommendations for Other Services       Precautions / Restrictions Precautions Precautions: Fall Restrictions Weight Bearing Restrictions: No Other Position/Activity Restrictions: WBAT    Mobility  Bed Mobility Overal bed mobility: Needs Assistance Bed Mobility: Sit to Supine;Supine to Sit     Supine to sit: Min guard;Supervision Sit to supine: Min guard;Supervision   General bed mobility comments: cues for sequence and use of L LE to self assist  Transfers Overall transfer level: Needs assistance Equipment used: Rolling walker (2 wheeled) Transfers: Sit to/from Stand Sit to Stand: Supervision         General transfer comment: cues for LE management and use of UEs to self assist  Ambulation/Gait Ambulation/Gait assistance: Supervision Gait Distance (Feet): 150 Feet Assistive device: Rolling walker (2 wheeled) Gait Pattern/deviations: Step-to pattern;Step-through pattern;Decreased step length - right;Decreased step length - left;Shuffle;Trunk flexed Gait velocity: decr   General Gait Details: cues for posture, position from RW and initial sequence   Stairs Stairs: Yes Stairs assistance: Min assist Stair Management: No rails;One rail Right;Two rails;Step to pattern;Backwards;Forwards;With walker;With cane Number of Stairs: 10 General stair comments: Pt negotiated stairs fwd and bkwd with RW; with  bil rails and with cane and rail;  cues for sequence and foot/cane/RW placement   Wheelchair Mobility    Modified Rankin (Stroke Patients Only)       Balance Overall balance assessment: Mild deficits observed, not formally tested                                          Cognition Arousal/Alertness: Awake/alert Behavior During Therapy: WFL for tasks assessed/performed Overall Cognitive Status: Within Functional Limits for tasks assessed                                        Exercises Total Joint Exercises Ankle Circles/Pumps: AROM;Both;15 reps;Supine Quad Sets: AROM;Both;10 reps;Supine Heel Slides: AAROM;Right;20 reps;Supine Hip ABduction/ADduction: AAROM;Right;15 reps;Supine Long Arc Quad: AROM;10 reps;Seated;Right    General Comments        Pertinent Vitals/Pain Pain Assessment: 0-10 Pain Score: 6  Pain Location: R hip Pain Descriptors / Indicators: Aching;Sore Pain Intervention(s): Limited activity within patient's tolerance;Monitored during session;Premedicated before session;Ice applied    Home Living                      Prior Function            PT Goals (current goals can now be found in the care plan section) Acute Rehab PT Goals Patient Stated Goal: Regain IND PT Goal Formulation: With patient Time For Goal Achievement: 08/31/18 Potential to Achieve Goals: Good Progress towards PT goals: Progressing toward goals    Frequency  7X/week      PT Plan Current plan remains appropriate    Co-evaluation              AM-PAC PT "6 Clicks" Daily Activity  Outcome Measure  Difficulty turning over in bed (including adjusting bedclothes, sheets and blankets)?: A Lot Difficulty moving from lying on back to sitting on the side of the bed? : A Lot Difficulty sitting down on and standing up from a chair with arms (e.g., wheelchair, bedside commode, etc,.)?: A Lot Help needed moving to and from a bed to  chair (including a wheelchair)?: None Help needed walking in hospital room?: None Help needed climbing 3-5 steps with a railing? : A Little 6 Click Score: 17    End of Session Equipment Utilized During Treatment: Gait belt Activity Tolerance: Patient tolerated treatment well Patient left: in chair;with call bell/phone within reach;with family/visitor present Nurse Communication: Mobility status PT Visit Diagnosis: Difficulty in walking, not elsewhere classified (R26.2)     Time: 8413-2440 PT Time Calculation (min) (ACUTE ONLY): 40 min  Charges:  $Gait Training: 8-22 mins $Therapeutic Exercise: 8-22 mins $Therapeutic Activity: 8-22 mins                     Mauro Kaufmann PT Acute Rehabilitation Services Pager [phone] Office [phone]    Lynore Coscia 08/25/2018, 1:00 PM

## 2018-08-25 NOTE — Progress Notes (Signed)
Physical Therapy Treatment Patient Details Name: John Guzman MRN: [id number] DOB: 1961-10-07 Today's Date: 08/25/2018    History of Present Illness Pt s/p R THR and with hx of back surgery, and R ankle surgery    PT Comments    Pt progressing well with mobility but with noted increased pain this am.  Pt eager for dc home this date.   Follow Up Recommendations  Follow surgeon's recommendation for DC plan and follow-up therapies     Equipment Recommendations  None recommended by PT    Recommendations for Other Services       Precautions / Restrictions Precautions Precautions: Fall Restrictions Weight Bearing Restrictions: No Other Position/Activity Restrictions: WBAT    Mobility  Bed Mobility Overal bed mobility: Needs Assistance Bed Mobility: Supine to Sit     Supine to sit: Min guard     General bed mobility comments: cues for sequence and use of L LE to self assist  Transfers Overall transfer level: Needs assistance Equipment used: Rolling walker (2 wheeled) Transfers: Sit to/from Stand Sit to Stand: Supervision         General transfer comment: cues for LE management and use of UEs to self assist  Ambulation/Gait Ambulation/Gait assistance: Min guard Gait Distance (Feet): 150 Feet Assistive device: Rolling walker (2 wheeled) Gait Pattern/deviations: Step-to pattern;Step-through pattern;Decreased step length - right;Decreased step length - left;Shuffle;Trunk flexed Gait velocity: decr   General Gait Details: cues for posture, position from RW and initial sequence   Stairs             Wheelchair Mobility    Modified Rankin (Stroke Patients Only)       Balance Overall balance assessment: Mild deficits observed, not formally tested                                          Cognition Arousal/Alertness: Awake/alert Behavior During Therapy: WFL for tasks assessed/performed Overall Cognitive Status: Within Functional  Limits for tasks assessed                                        Exercises Total Joint Exercises Ankle Circles/Pumps: AROM;Both;15 reps;Supine Quad Sets: AROM;Both;10 reps;Supine Heel Slides: AAROM;Right;20 reps;Supine Hip ABduction/ADduction: AAROM;Right;15 reps;Supine    General Comments        Pertinent Vitals/Pain Pain Assessment: 0-10 Pain Score: 6  Pain Location: R hip Pain Descriptors / Indicators: Aching;Sore Pain Intervention(s): Limited activity within patient's tolerance;Monitored during session;Premedicated before session;Ice applied    Home Living                      Prior Function            PT Goals (current goals can now be found in the care plan section) Acute Rehab PT Goals Patient Stated Goal: Regain IND PT Goal Formulation: With patient Time For Goal Achievement: 08/31/18 Potential to Achieve Goals: Good Progress towards PT goals: Progressing toward goals    Frequency    7X/week      PT Plan Current plan remains appropriate    Co-evaluation              AM-PAC PT "6 Clicks" Daily Activity  Outcome Measure  Difficulty turning over in bed (including adjusting bedclothes, sheets and blankets)?: A Lot  Difficulty moving from lying on back to sitting on the side of the bed? : A Lot Difficulty sitting down on and standing up from a chair with arms (e.g., wheelchair, bedside commode, etc,.)?: A Lot Help needed moving to and from a bed to chair (including a wheelchair)?: A Little Help needed walking in hospital room?: A Little Help needed climbing 3-5 steps with a railing? : A Lot 6 Click Score: 14    End of Session Equipment Utilized During Treatment: Gait belt Activity Tolerance: Patient tolerated treatment well Patient left: in chair;with call bell/phone within reach;with family/visitor present Nurse Communication: Mobility status PT Visit Diagnosis: Difficulty in walking, not elsewhere classified (R26.2)      Time: 3875-6433 PT Time Calculation (min) (ACUTE ONLY): 36 min  Charges:  $Gait Training: 8-22 mins $Therapeutic Exercise: 8-22 mins                     Mauro Kaufmann PT Acute Rehabilitation Services Pager [phone] Office [phone]    Deanza Upperman 08/25/2018, 9:31 AM

## 2018-10-18 ENCOUNTER — Other Ambulatory Visit: Payer: Self-pay | Admitting: Orthopedic Surgery

## 2018-10-18 DIAGNOSIS — Z95828 Presence of other vascular implants and grafts: Secondary | ICD-10-CM

## 2018-10-27 ENCOUNTER — Encounter: Payer: Self-pay | Admitting: Radiology

## 2018-10-27 ENCOUNTER — Ambulatory Visit
Admission: RE | Admit: 2018-10-27 | Discharge: 2018-10-27 | Disposition: A | Discharge status: Home / Self Care | Payer: Non-veteran care | Source: Ambulatory Visit | Attending: Orthopedic Surgery | Admitting: Orthopedic Surgery

## 2018-10-27 DIAGNOSIS — Z95828 Presence of other vascular implants and grafts: Secondary | ICD-10-CM

## 2018-10-27 HISTORY — PX: IR RADIOLOGIST EVAL & MGMT: IMG5224

## 2018-10-27 NOTE — Progress Notes (Signed)
Chief Complaint: IVC filter, placed perioperative  Referring Physician(s): Swinteck,Brian  History of Present Illness: John Guzman is a 58 y.o. male presenting as a follow up appointment to [facility] today, having received a peri-operative IVC filter 08/17/2018.    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 Linna CapriceSwinteck 08/24/2018.  He required IVC filter placement.    Today he arrives with his wife for the interview.    He has recovered well from his surgery, and is successfully taking his NOAC.  He denies any new bleeding episodes.    He has not had a recent hospitalization.    DVT study today is negative for DVT.    Past Medical History:  Diagnosis Date  . DVT of lower extremity (deep venous thrombosis) (HCC) 09/2017   Right Lower Extremity  . Elevated cholesterol   . Hx of pulmonary embolus 09/2017    Past Surgical History:  Procedure Laterality Date  . ANKLE SURGERY Right 1981   metal in place   . BACK SURGERY  2001  . IR IVC FILTER PLMT / S&I /IMG GUID/MOD SED  08/17/2018  . IR RADIOLOGIST EVAL & MGMT  08/04/2018  . IR RADIOLOGIST EVAL & MGMT  10/27/2018  . LASIK Bilateral 2016  . TOTAL HIP ARTHROPLASTY Right 08/24/2018   Procedure: RIGHT TOTAL HIP ARTHROPLASTY ANTERIOR APPROACH;  Surgeon: Samson FredericSwinteck, Brian, MD;  Location: [facility];  Service: Orthopedics;  Laterality: Right;  Needs RNFA    Allergies: Patient has no known allergies.  Medications: Prior to Admission medications   Medication Sig Start Date End Date Taking? Authorizing Provider  apixaban (ELIQUIS) 5 MG TABS tablet Take 5 mg by mouth 2 (two) times daily.   Yes [provider]     No family history on file.  Social History   Socioeconomic History  . Marital status: Married    Spouse name: Not on file  . Number of children: Not on file  . Years of education: Not on file  . Highest education level: Not on file  Occupational History  .  Not on file  Social Needs  . Financial resource strain: Not on file  . Food insecurity:    Worry: Not on file    Inability: Not on file  . Transportation needs:    Medical: Not on file    Non-medical: Not on file  Tobacco Use  . Smoking status: Never Smoker  . Smokeless tobacco: Never Used  Substance and Sexual Activity  . Alcohol use: Yes    Alcohol/week: 5.0 standard drinks    Types: 5 Cans of beer per week  . Drug use: Never  . Sexual activity: Not on file  Lifestyle  . Physical activity:    Days per week: Not on file    Minutes per session: Not on file  . Stress: Not on file  Relationships  . Social connections:    Talks on phone: Not on file    Gets together: Not on file    Attends religious service: Not on file    Active member of club or organization: Not on file    Attends meetings of clubs or organizations: Not on file    Relationship status: Not on file  Other Topics Concern  . Not on file  Social History Narrative  . Not on file      Review of Systems: A 12 point ROS discussed and pertinent positives are indicated  in the HPI above.  All other systems are negative.  Review of Systems  Vital Signs: BP 121/76   Pulse 66   Temp 98.2 F (36.8 C) (Oral)   Resp 14   Ht 5\' 6"  (1.676 m)   Wt 81.6 kg   SpO2 98%   BMI 29.05 kg/m   Physical Exam General: 58 yo male appearing   stated age.  Well-developed, well-nourished.  No distress. HEENT: Atraumatic, normocephalic.  Conjugate gaze, extra-ocular motor intact. No scleral icterus or scleral injection. No lesions on external ears, nose, lips, or gums.  Oral mucosa moist, pink.  Neck: Symmetric with no goiter enlargement.  Chest/Lungs:  Symmetric chest with inspiration/expiration.  No labored breathing.    Heart:    No JVD appreciated.  Abdomen:  Soft, NT/ND, with + bowel sounds.   Genito-urinary: Deferred Neurologic: Alert & Oriented to person, place, and time.   Normal affect and insight.  Appropriate  questions.  Moving all 4 extremities with gross sensory intact.      Imaging: US Venous Img Lower Bilateral  Result Date: 10/27/2018 CLINICAL DATA:  58 year old male with a history remote DVT, returns for repeat duplex lower extremity before removal of a perioperative IVC filter. EXAM: BILATERAL LOWER EXTREMITY VENOUS DOPPLER ULTRASOUND TECHNIQUE: Gray-scale sonography with graded compression, as well as color Doppler and duplex ultrasound were performed to evaluate the lower extremity deep venous systems from the level of the common femoral vein and including the common femoral, femoral, profunda femoral, popliteal and calf veins including the posterior tibial, peroneal and gastrocnemius veins when visible. The superficial great saphenous vein was also interrogated. Spectral Doppler was utilized to evaluate flow at rest and with distal augmentation maneuvers in the common femoral, femoral and popliteal veins. COMPARISON:  None. FINDINGS: RIGHT LOWER EXTREMITY Common Femoral Vein: No evidence of thrombus. Normal compressibility, respiratory phasicity and response to augmentation. Saphenofemoral Junction: No evidence of thrombus. Normal compressibility and flow on color Doppler imaging. Profunda Femoral Vein: No evidence of thrombus. Normal compressibility and flow on color Doppler imaging. Femoral Vein: No evidence of thrombus. Normal compressibility, respiratory phasicity and response to augmentation. Popliteal Vein: No evidence of thrombus. Normal compressibility, respiratory phasicity and response to augmentation. Calf Veins: No evidence of thrombus. Normal compressibility and flow on color Doppler imaging. Superficial Great Saphenous Vein: No evidence of thrombus. Normal compressibility and flow on color Doppler imaging. Other Findings: Nonspecific hypoechoic fluid in the right hip at the site of prior surgery. LEFT LOWER EXTREMITY Common Femoral Vein: No evidence of thrombus. Normal compressibility,  respiratory phasicity and response to augmentation. Saphenofemoral Junction: No evidence of thrombus. Normal compressibility and flow on color Doppler imaging. Profunda Femoral Vein: No evidence of thrombus. Normal compressibility and flow on color Doppler imaging. Femoral Vein: No evidence of thrombus. Normal compressibility, respiratory phasicity and response to augmentation. Popliteal Vein: No evidence of thrombus. Normal compressibility, respiratory phasicity and response to augmentation. Calf Veins: No evidence of thrombus. Normal compressibility and flow on color Doppler imaging. Superficial Great Saphenous Vein: No evidence of thrombus. Normal compressibility and flow on color Doppler imaging. Other Findings:  None. IMPRESSION: Sonographic survey of the bilateral lower extremities negative for DVT Presumed postsurgical changes in the right hip soft tissues. Electronically Signed   By: Gilmer Mor D.O.   On: 10/27/2018 12:35   Ir Radiologist Eval & Mgmt  Result Date: 10/27/2018 Please refer to notes tab for details about interventional procedure. (Op Note)   Labs:  CBC: Recent Labs  08/17/18 0757 08/25/18 0546  WBC 5.0 12.5*  HGB 15.1 13.3  HCT 47.9 41.5  PLT 244 260    COAGS: Recent Labs    08/17/18 0757  INR 1.00    BMP: Recent Labs    08/17/18 0757 08/25/18 0546  NA 140 137  K 4.3 4.7  CL 109 104  CO2 22 26  GLUCOSE 102* 151*  BUN 21* 12  CALCIUM 8.8* 8.6*  CREATININE 0.86 0.75  GFRNONAA >60 >60  GFRAA >60 >60    LIVER FUNCTION TESTS: No results for input(s): BILITOT, AST, ALT, ALKPHOS, PROT, ALBUMIN in the last 8760 hours.  TUMOR MARKERS: No results for input(s): AFPTM, CEA, CA199, CHROMGRNA in the last 8760 hours.  Assessment and Plan:  Mr Amalia GreenhouseWilda is a 58 year old male with a history of remote DVT, and is on blood thinners, now presenting for removal of peri-operative IVC filter that was placed 08/17/2018 for an orthopedic surgery.    He has  successfully restarted his NOAC, and does not need IVC filter.    I discussed the logistics of the IVC retrieval, with risks to include: bleeding, infection, contrast reaction, kidney injury, air embolism, inability to retrieve, need for further procedure/surgery, cardiopulmonary collapse, death.    After our discussion, he would like to proceed with retrieval attempt.   Plan: - Plan to proceed with IVC retrieval, [facility]  Earliest available, no necessarily Dr. Loreta AveWagner    Electronically Signed: Gilmer MorJaime Zimal Weisensel 10/27/2018, 12:42 PM   I spent a total of    25 Minutes in face to face in clinical consultation, greater than 50% of which was counseling/coordinating care for IVC filter presence, possible retrieval.

## 2018-10-28 ENCOUNTER — Other Ambulatory Visit (HOSPITAL_COMMUNITY): Payer: Self-pay | Admitting: Interventional Radiology

## 2018-10-28 DIAGNOSIS — Z95828 Presence of other vascular implants and grafts: Secondary | ICD-10-CM

## 2018-11-18 ENCOUNTER — Other Ambulatory Visit: Payer: Self-pay | Admitting: Radiology

## 2018-11-21 ENCOUNTER — Ambulatory Visit (HOSPITAL_COMMUNITY)
Admission: RE | Admit: 2018-11-21 | Discharge: 2018-11-21 | Disposition: A | Discharge status: Home / Self Care | Payer: No Typology Code available for payment source | Source: Ambulatory Visit | Attending: Interventional Radiology | Admitting: Interventional Radiology

## 2018-11-21 ENCOUNTER — Other Ambulatory Visit: Payer: Self-pay

## 2018-11-21 ENCOUNTER — Encounter (HOSPITAL_COMMUNITY): Payer: Self-pay

## 2018-11-21 DIAGNOSIS — Z86718 Personal history of other venous thrombosis and embolism: Secondary | ICD-10-CM | POA: Insufficient documentation

## 2018-11-21 DIAGNOSIS — Z452 Encounter for adjustment and management of vascular access device: Secondary | ICD-10-CM | POA: Diagnosis present

## 2018-11-21 DIAGNOSIS — E785 Hyperlipidemia, unspecified: Secondary | ICD-10-CM | POA: Insufficient documentation

## 2018-11-21 DIAGNOSIS — E7800 Pure hypercholesterolemia, unspecified: Secondary | ICD-10-CM | POA: Insufficient documentation

## 2018-11-21 DIAGNOSIS — Z96641 Presence of right artificial hip joint: Secondary | ICD-10-CM | POA: Diagnosis not present

## 2018-11-21 DIAGNOSIS — Z95828 Presence of other vascular implants and grafts: Secondary | ICD-10-CM

## 2018-11-21 DIAGNOSIS — Z86711 Personal history of pulmonary embolism: Secondary | ICD-10-CM | POA: Insufficient documentation

## 2018-11-21 DIAGNOSIS — M549 Dorsalgia, unspecified: Secondary | ICD-10-CM | POA: Insufficient documentation

## 2018-11-21 DIAGNOSIS — Z7901 Long term (current) use of anticoagulants: Secondary | ICD-10-CM | POA: Insufficient documentation

## 2018-11-21 HISTORY — PX: IR IVC FILTER RETRIEVAL / S&I /IMG GUID/MOD SED: IMG5308

## 2018-11-21 LAB — CBC WITH DIFFERENTIAL/PLATELET
Abs Immature Granulocytes: 0.01 10*3/uL (ref 0.00–0.07)
BASOS ABS: 0 10*3/uL (ref 0.0–0.1)
Basophils Relative: 1 %
EOS PCT: 4 %
Eosinophils Absolute: 0.2 10*3/uL (ref 0.0–0.5)
HCT: 47.2 % (ref 39.0–52.0)
HEMOGLOBIN: 14.8 g/dL (ref 13.0–17.0)
Immature Granulocytes: 0 %
LYMPHS PCT: 43 %
Lymphs Abs: 2.6 10*3/uL (ref 0.7–4.0)
MCH: 28.3 pg (ref 26.0–34.0)
MCHC: 31.4 g/dL (ref 30.0–36.0)
MCV: 90.2 fL (ref 80.0–100.0)
Monocytes Absolute: 0.5 10*3/uL (ref 0.1–1.0)
Monocytes Relative: 9 %
NEUTROS PCT: 43 %
NRBC: 0 % (ref 0.0–0.2)
Neutro Abs: 2.5 10*3/uL (ref 1.7–7.7)
Platelets: 259 10*3/uL (ref 150–400)
RBC: 5.23 MIL/uL (ref 4.22–5.81)
RDW: 12.4 % (ref 11.5–15.5)
WBC: 5.9 10*3/uL (ref 4.0–10.5)

## 2018-11-21 LAB — BASIC METABOLIC PANEL
ANION GAP: 7 (ref 5–15)
BUN: 22 mg/dL — ABNORMAL HIGH (ref 6–20)
CHLORIDE: 107 mmol/L (ref 98–111)
CO2: 25 mmol/L (ref 22–32)
CREATININE: 0.85 mg/dL (ref 0.61–1.24)
Calcium: 9 mg/dL (ref 8.9–10.3)
GFR calc non Af Amer: >60 mL/min (ref >60)
Glucose, Bld: 102 mg/dL — ABNORMAL HIGH (ref 70–99)
Potassium: 4 mmol/L (ref 3.5–5.1)
SODIUM: 139 mmol/L (ref 135–145)

## 2018-11-21 LAB — PROTIME-INR
INR: 0.95
PROTHROMBIN TIME: 12.6 s (ref 11.4–15.2)

## 2018-11-21 MED ORDER — LIDOCAINE HCL 1 % IJ SOLN
INTRAMUSCULAR | Status: AC | PRN
  Administered 2018-11-21: 5 mL

## 2018-11-21 MED ORDER — IOHEXOL 300 MG/ML  SOLN
100.0000 mL | Freq: Once | INTRAMUSCULAR | Status: AC | PRN
  Administered 2018-11-21: 24 mL via INTRAVENOUS

## 2018-11-21 MED ORDER — LIDOCAINE HCL 1 % IJ SOLN
INTRAMUSCULAR | Status: AC
  Filled 2018-11-21: qty 20

## 2018-11-21 MED ORDER — FENTANYL CITRATE (PF) 100 MCG/2ML IJ SOLN
INTRAMUSCULAR | Status: AC
  Filled 2018-11-21: qty 2

## 2018-11-21 MED ORDER — MIDAZOLAM HCL 2 MG/2ML IJ SOLN
INTRAMUSCULAR | Status: AC
  Filled 2018-11-21: qty 4

## 2018-11-21 MED ORDER — FENTANYL CITRATE (PF) 100 MCG/2ML IJ SOLN
INTRAMUSCULAR | Status: AC | PRN
  Administered 2018-11-21 (×2): 50 ug via INTRAVENOUS

## 2018-11-21 MED ORDER — MIDAZOLAM HCL 2 MG/2ML IJ SOLN
INTRAMUSCULAR | Status: AC | PRN
  Administered 2018-11-21 (×2): 1 mg via INTRAVENOUS

## 2018-11-21 MED ORDER — IOPAMIDOL (ISOVUE-300) INJECTION 61%
50.0000 mL | Freq: Once | INTRAVENOUS | Status: AC | PRN
  Administered 2018-11-21: 20 mL via INTRAVENOUS

## 2018-11-21 MED ORDER — SODIUM CHLORIDE 0.9 % IV SOLN
INTRAVENOUS | Status: DC
  Administered 2018-11-21: 08:00:00 via INTRAVENOUS

## 2018-11-21 MED ORDER — IOPAMIDOL (ISOVUE-300) INJECTION 61%
INTRAVENOUS | Status: AC
  Administered 2018-11-21: 20 mL via INTRAVENOUS
  Filled 2018-11-21: qty 50

## 2018-11-21 NOTE — Discharge Instructions (Signed)
Inferior Vena Cava Filter Removal, Care After °This sheet gives you information about how to care for yourself after your procedure. Your health care provider may also give you more specific instructions. If you have problems or questions, contact your health care provider. °What can I expect after the procedure? °After the procedure, it is common to have: °· Mild pain and bruising around your incision in your neck or groin. °· Fatigue. °Follow these instructions at home: °Incision care ° °· Follow instructions from your health care provider about how to take care of your incision. Make sure you: °? Wash your hands with soap and water before you change your bandage (dressing). If soap and water are not available, use hand sanitizer. °? Change your dressing as told by your health care provider. °· Check your incision area every day for signs of infection. Check for: °? Redness, swelling, or more pain. °? Fluid or blood. °? Warmth. °? Pus or a bad smell. °General instructions °· Take over-the-counter and prescription medicines only as told by your health care provider. °· Do not take baths, swim, or use a hot tub until your health care provider approves. Ask your health care provider if you may take showers. You may only be allowed to take sponge baths. °· Do not drive for 24 hours if you were given a medicine to help you relax (sedative) during your procedure. °· Return to your normal activities as told by your health care provider. Ask your health care provider what activities are safe for you. °· Keep all follow-up visits as told by your health care provider. This is important. °Contact a health care provider if: °· You have chills or a fever. °· You have redness, swelling, or more pain around your incision. °· Your incision feels warm to the touch. °· You have pus or a bad smell coming from your incision. °Get help right away if: °· You have blood coming from your incision (active bleeding). °? If you have  bleeding from the incision site, lie down, apply pressure to the area with a clean cloth or gauze, and get help right away. °· You have chest pain. °· You have difficulty breathing. °Summary °· Follow instructions from your health care provider about how to take care of your incision. °· Return to your normal activities as told by your health care provider. °· Check your incision area every day for signs of infection. °· Get help right away if you have active bleeding, chest pain, or trouble breathing. °This information is not intended to replace advice given to you by your health care provider. Make sure you discuss any questions you have with your health care provider. °Document Released: 04/15/2017 Document Revised: 04/15/2017 Document Reviewed: 04/15/2017 °Elsevier Interactive Patient Education © 2019 [employer] ° ° ° ° °Moderate Conscious Sedation, Adult, Care After °These instructions provide you with information about caring for yourself after your procedure. Your health care provider may also give you more specific instructions. Your treatment has been planned according to current medical practices, but problems sometimes occur. Call your health care provider if you have any problems or questions after your procedure. °What can I expect after the procedure? °After your procedure, it is common: °· To feel sleepy for several hours. °· To feel clumsy and have poor balance for several hours. °· To have poor judgment for several hours. °· To vomit if you eat too soon. °Follow these instructions at home: °For at least 24 hours after the procedure: ° °·   Do not: °? Participate in activities where you could fall or become injured. °? Drive. °? Use heavy machinery. °? Drink alcohol. °? Take sleeping pills or medicines that cause drowsiness. °? Make important decisions or sign legal documents. °? Take care of children on your own. °· Rest. °Eating and drinking °· Follow the diet recommended by your health care  provider. °· If you vomit: °? Drink water, juice, or soup when you can drink without vomiting. °? Make sure you have little or no nausea before eating solid foods. °General instructions °· Have a responsible adult stay with you until you are awake and alert. °· Take over-the-counter and prescription medicines only as told by your health care provider. °· If you smoke, do not smoke without supervision. °· Keep all follow-up visits as told by your health care provider. This is important. °Contact a health care provider if: °· You keep feeling nauseous or you keep vomiting. °· You feel light-headed. °· You develop a rash. °· You have a fever. °Get help right away if: °· You have trouble breathing. °This information is not intended to replace advice given to you by your health care provider. Make sure you discuss any questions you have with your health care provider. °Document Released: 07/26/2013 Document Revised: 03/09/2016 Document Reviewed: 01/25/2016 °Elsevier Interactive Patient Education © 2019 Elsevier Inc[employer] ° °

## 2018-11-21 NOTE — Procedures (Signed)
Interventional Radiology Procedure Note  Procedure: Retrieval of IVC filter via right IJ approach.  Findings: filter retrieved in entirety.  No fracture. .  Complications: None  Recommendations:  - Ok to shower tomorrow - Do not submerge for 7 days - Routine wound care   Signed,  Yvone Neu. Loreta Ave, DO

## 2018-11-21 NOTE — H&P (Signed)
Chief Complaint: Patient was seen in consultation today for IVC filter removal.  Referring Physician(s): Gilmer MorWagner,Jaime  Supervising Physician: Gilmer MorWagner, Jaime  [facility]  History of Present Illness: John Guzman is a 58 y.o. male with a past medical history significant for HLD, RLE DVT and PE (09/2017) currently on Eliquis with previously placed IVC filter on 08/17/2018 as PE prophylaxis prior to right hip replacement. He successfully underwent right hip replacement on 08/24/2018 and he presents today for IVC filter retrieval.   Patient reports he has been doing well since filter placement and hip surgery. He continues to have chronic hip and back pain which is at baseline. He reports sinus congestion recently and one episode of blood on a tissue after blowing his nose this morning, however he denies any significant bleeding. He continues to take Eliquis as prescribed.   Past Medical History:  Diagnosis Date  . DVT of lower extremity (deep venous thrombosis) (HCC) 09/2017   Right Lower Extremity  . Elevated cholesterol   . Hx of pulmonary embolus 09/2017    Past Surgical History:  Procedure Laterality Date  . ANKLE SURGERY Right 1981   metal in place   . BACK SURGERY  2001  . IR IVC FILTER PLMT / S&I /IMG GUID/MOD SED  08/17/2018  . IR RADIOLOGIST EVAL & MGMT  08/04/2018  . IR RADIOLOGIST EVAL & MGMT  10/27/2018  . LASIK Bilateral 2016  . TOTAL HIP ARTHROPLASTY Right 08/24/2018   Procedure: RIGHT TOTAL HIP ARTHROPLASTY ANTERIOR APPROACH;  Surgeon: Samson FredericSwinteck, Brian, MD;  Location: [facility];  Service: Orthopedics;  Laterality: Right;  Needs RNFA    Allergies: Patient has no known allergies.  Medications: Prior to Admission medications   Medication Sig Start Date End Date Taking? Authorizing Provider  apixaban (ELIQUIS) 5 MG TABS tablet Take 5 mg by mouth 2 (two) times daily.   Yes [provider]     History reviewed. No pertinent family  history.  Social History   Socioeconomic History  . Marital status: Married    Spouse name: Not on file  . Number of children: Not on file  . Years of education: Not on file  . Highest education level: Not on file  Occupational History  . Not on file  Social Needs  . Financial resource strain: Not on file  . Food insecurity:    Worry: Not on file    Inability: Not on file  . Transportation needs:    Medical: Not on file    Non-medical: Not on file  Tobacco Use  . Smoking status: Never Smoker  . Smokeless tobacco: Never Used  Substance and Sexual Activity  . Alcohol use: Yes    Alcohol/week: 5.0 standard drinks    Types: 5 Cans of beer per week  . Drug use: Never  . Sexual activity: Not on file  Lifestyle  . Physical activity:    Days per week: Not on file    Minutes per session: Not on file  . Stress: Not on file  Relationships  . Social connections:    Talks on phone: Not on file    Gets together: Not on file    Attends religious service: Not on file    Active member of club or organization: Not on file    Attends meetings of clubs or organizations: Not on file    Relationship status: Not on file  Other Topics Concern  . Not on file  Social History  Narrative  . Not on file     Review of Systems: A 12 point ROS discussed and pertinent positives are indicated in the HPI above.  All other systems are negative.  Review of Systems  Constitutional: Negative for chills and fever.  HENT: Positive for nosebleeds (x1 - minimal; after blowing nose this morning) and sinus pressure.   Respiratory: Negative for cough and shortness of breath.   Cardiovascular: Negative for chest pain.  Gastrointestinal: Negative for abdominal pain, blood in stool, diarrhea, nausea and vomiting.  Genitourinary: Negative for dysuria and hematuria.  Musculoskeletal: Positive for arthralgias (chronic hip pain) and back pain (Chronic).  Neurological: Negative for dizziness, syncope and  headaches.  Psychiatric/Behavioral: Negative for confusion.    Vital Signs: BP (!) 127/94 (BP Location: Right Arm)   Pulse 64   Temp 98 F (36.7 C) (Oral)   Resp 18   SpO2 97%   Physical Exam Vitals signs reviewed.  Constitutional:      General: He is not in acute distress.    Appearance: Normal appearance. He is not ill-appearing.  HENT:     Head: Normocephalic and atraumatic.  Cardiovascular:     Rate and Rhythm: Normal rate and regular rhythm.  Pulmonary:     Effort: Pulmonary effort is normal.     Breath sounds: Normal breath sounds.  Abdominal:     General: There is no distension.     Palpations: Abdomen is soft.     Tenderness: There is no abdominal tenderness.  Skin:    General: Skin is warm and dry.  Neurological:     Mental Status: He is alert and oriented to person, place, and time.  Psychiatric:        Mood and Affect: Mood normal.        Behavior: Behavior normal.        Thought Content: Thought content normal.        Judgment: Judgment normal.      MD Evaluation Airway: WNL Heart: WNL Abdomen: WNL Chest/ Lungs: WNL ASA  Classification: 2 Mallampati/Airway Score: One   Imaging: [place] Venous Img Lower Bilateral  Result Date: 10/27/2018 CLINICAL DATA:  58 year old male with a history remote DVT, returns for repeat duplex lower extremity before removal of a perioperative IVC filter. EXAM: BILATERAL LOWER EXTREMITY VENOUS DOPPLER ULTRASOUND TECHNIQUE: Gray-scale sonography with graded compression, as well as color Doppler and duplex ultrasound were performed to evaluate the lower extremity deep venous systems from the level of the common femoral vein and including the common femoral, femoral, profunda femoral, popliteal and calf veins including the posterior tibial, peroneal and gastrocnemius veins when visible. The superficial great saphenous vein was also interrogated. Spectral Doppler was utilized to evaluate flow at rest and with distal augmentation  maneuvers in the common femoral, femoral and popliteal veins. COMPARISON:  None. FINDINGS: RIGHT LOWER EXTREMITY Common Femoral Vein: No evidence of thrombus. Normal compressibility, respiratory phasicity and response to augmentation. Saphenofemoral Junction: No evidence of thrombus. Normal compressibility and flow on color Doppler imaging. Profunda Femoral Vein: No evidence of thrombus. Normal compressibility and flow on color Doppler imaging. Femoral Vein: No evidence of thrombus. Normal compressibility, respiratory phasicity and response to augmentation. Popliteal Vein: No evidence of thrombus. Normal compressibility, respiratory phasicity and response to augmentation. Calf Veins: No evidence of thrombus. Normal compressibility and flow on color Doppler imaging. Superficial Great Saphenous Vein: No evidence of thrombus. Normal compressibility and flow on color Doppler imaging. Other Findings: Nonspecific hypoechoic fluid in the  right hip at the site of prior surgery. LEFT LOWER EXTREMITY Common Femoral Vein: No evidence of thrombus. Normal compressibility, respiratory phasicity and response to augmentation. Saphenofemoral Junction: No evidence of thrombus. Normal compressibility and flow on color Doppler imaging. Profunda Femoral Vein: No evidence of thrombus. Normal compressibility and flow on color Doppler imaging. Femoral Vein: No evidence of thrombus. Normal compressibility, respiratory phasicity and response to augmentation. Popliteal Vein: No evidence of thrombus. Normal compressibility, respiratory phasicity and response to augmentation. Calf Veins: No evidence of thrombus. Normal compressibility and flow on color Doppler imaging. Superficial Great Saphenous Vein: No evidence of thrombus. Normal compressibility and flow on color Doppler imaging. Other Findings:  None. IMPRESSION: Sonographic survey of the bilateral lower extremities negative for DVT Presumed postsurgical changes in the right hip soft  tissues. Electronically Signed   By: Gilmer Mor D.O.   On: 10/27/2018 12:35   Ir Radiologist Eval & Mgmt  Result Date: 10/27/2018 Please refer to notes tab for details about interventional procedure. (Op Note)   Labs:  CBC: Recent Labs    08/17/18 0757 08/25/18 0546 11/21/18 0807  WBC 5.0 12.5* 5.9  HGB 15.1 13.3 14.8  HCT 47.9 41.5 47.2  PLT 244 260 259    COAGS: Recent Labs    08/17/18 0757 11/21/18 0807  INR 1.00 0.95    BMP: Recent Labs    08/17/18 0757 08/25/18 0546 11/21/18 0807  NA 140 137 139  K 4.3 4.7 4.0  CL 109 104 107  CO2 22 26 25   GLUCOSE 102* 151* 102*  BUN 21* 12 22*  CALCIUM 8.8* 8.6* 9.0  CREATININE 0.86 0.75 0.85  GFRNONAA >60 >60 >60  GFRAA >60 >60 >60    LIVER FUNCTION TESTS: No results for input(s): BILITOT, AST, ALT, ALKPHOS, PROT, ALBUMIN in the last 8760 hours.  TUMOR MARKERS: No results for input(s): AFPTM, CEA, CA199, CHROMGRNA in the last 8760 hours.  Assessment and Plan:  Patient with history of DVT and PE on Eliquis who had IVC filter placed 08/17/2018 by Dr. Loreta Ave as PE prophylaxis prior to right hip replacement. He successfully underwent right hip replacement 08/24/2018 and presents today for IVC filter removal.   Patient has been NPO since 9 pm last night. Afebrile, WBC 5.9, hgb 14.8, plt 259, INR 0.95, creatinine 0.85.  Risks and benefits discussed with the patient including, but not limited to bleeding, infection, contrast induced renal failure, filter fracture or migration which can lead to emergency surgery or even death, strut penetration with damage or irritation to adjacent structures and caval thrombosis.  All of the patient's questions were answered, patient is agreeable to proceed.  Consent signed and in chart.  Thank you for this interesting consult.  I greatly enjoyed meeting SAMIUELA WURTZEL and look forward to participating in their care.  A copy of this report was sent to the requesting provider on this  date.  Electronically Signed: Villa Herb, PA-C 11/21/2018, 8:38 AM   I spent a total of  25 Minutes in face to face in clinical consultation, greater than 50% of which was counseling/coordinating care for IVC filter retrieval.

## 2019-09-12 ENCOUNTER — Other Ambulatory Visit: Payer: Self-pay | Admitting: Orthopedic Surgery

## 2019-09-12 DIAGNOSIS — Z86718 Personal history of other venous thrombosis and embolism: Secondary | ICD-10-CM

## 2019-09-20 ENCOUNTER — Encounter: Payer: Self-pay | Admitting: *Deleted

## 2019-09-20 ENCOUNTER — Ambulatory Visit
Admission: RE | Admit: 2019-09-20 | Discharge: 2019-09-20 | Disposition: A | Discharge status: Home / Self Care | Payer: Non-veteran care | Source: Ambulatory Visit | Attending: Orthopedic Surgery | Admitting: Orthopedic Surgery

## 2019-09-20 DIAGNOSIS — Z86718 Personal history of other venous thrombosis and embolism: Secondary | ICD-10-CM

## 2019-09-20 HISTORY — PX: IR RADIOLOGIST EVAL & MGMT: IMG5224

## 2019-09-20 NOTE — Consult Note (Signed)
Chief Complaint: Patient was seen in consultation today for IVC filter placement at the request of Brooks,Dahari  Referring Physician(s): Brooks,Dahari  History of Present Illness: John Guzman is a 58 y.o. 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  He is scheduled to undergo spinal/sacral surgery and will need to be taken off anticoagulation and will need pulmonary embolism prophylaxis.  Patient previously had a retrievable IVC filter prior to a right hip replacement in 2019.  Patient had an IVC filter placed and retrieved without complication.  Patient's primary medical issues right now are related to his back and hip symptoms.  Past Medical History:  Diagnosis Date  . DVT of lower extremity (deep venous thrombosis) (HCC) 09/2017   Right Lower Extremity  . Elevated cholesterol   . Hx of pulmonary embolus 09/2017    Past Surgical History:  Procedure Laterality Date  . ANKLE SURGERY Right 1981   metal in place   . BACK SURGERY  2001  . IR IVC FILTER PLMT / S&I /IMG GUID/MOD SED  08/17/2018  . IR IVC FILTER RETRIEVAL / S&I /IMG GUID/MOD SED  11/21/2018  . IR RADIOLOGIST EVAL & MGMT  08/04/2018  . IR RADIOLOGIST EVAL & MGMT  10/27/2018  . IR RADIOLOGIST EVAL & MGMT  09/20/2019  . LASIK Bilateral 2016  . TOTAL HIP ARTHROPLASTY Right 08/24/2018   Procedure: RIGHT TOTAL HIP ARTHROPLASTY ANTERIOR APPROACH;  Surgeon: Samson Frederic, MD;  Location: [facility];  Service: Orthopedics;  Laterality: Right;  Needs RNFA    Allergies: Patient has no known allergies.  Medications: Prior to Admission medications   Medication Sig Start Date End Date Taking? Authorizing Provider  apixaban (ELIQUIS) 5 MG TABS tablet Take 5 mg by mouth 2 (two) times daily.    [provider]     No family history on file.  Social History   Socioeconomic History  . Marital status: Married    Spouse name: Not on file  . Number of  children: Not on file  . Years of education: Not on file  . Highest education level: Not on file  Occupational History  . Not on file  Social Needs  . Financial resource strain: Not on file  . Food insecurity    Worry: Not on file    Inability: Not on file  . Transportation needs    Medical: Not on file    Non-medical: Not on file  Tobacco Use  . Smoking status: Never Smoker  . Smokeless tobacco: Never Used  Substance and Sexual Activity  . Alcohol use: Yes    Alcohol/week: 5.0 standard drinks    Types: 5 Cans of beer per week  . Drug use: Never  . Sexual activity: Not on file  Lifestyle  . Physical activity    Days per week: Not on file    Minutes per session: Not on file  . Stress: Not on file  Relationships  . Social Musician on phone: Not on file    Gets together: Not on file    Attends religious service: Not on file    Active member of club or organization: Not on file    Attends meetings of clubs or organizations: Not on file    Relationship status: Not on file  Other Topics Concern  . Not on file  Social History Narrative  . Not on file    Review  of Systems  Musculoskeletal: Positive for back pain.    Vital Signs: There were no vitals taken for this visit.  Physical Exam No physical examination due to telephone consult.    Imaging: Ir Radiologist Eval & Mgmt  Result Date: 09/20/2019 Please refer to notes tab for details about interventional procedure. (Op Note)   Labs:  CBC: Recent Labs    11/21/18 0807  WBC 5.9  HGB 14.8  HCT 47.2  PLT 259    COAGS: Recent Labs    11/21/18 0807  INR 0.95    BMP: Recent Labs    11/21/18 0807  NA 139  K 4.0  CL 107  CO2 25  GLUCOSE 102*  BUN 22*  CALCIUM 9.0  CREATININE 0.85  GFRNONAA >60  GFRAA >60    LIVER FUNCTION TESTS: No results for input(s): BILITOT, AST, ALT, ALKPHOS, PROT, ALBUMIN in the last 8760 hours.  TUMOR MARKERS: No results for input(s): AFPTM, CEA,  CA199, CHROMGRNA in the last 8760 hours.  Assessment and Plan:  58 year old with history of thromboembolic disease and takes chronic anticoagulation.  Patient is currently taking Eliquis.  Patient will need to stop anticoagulation for his spine surgery and needs a retrievable IVC filter.  Patient is a very familiar with the IVC filter placement and retrieval since he just went through the procedures last year.  Patient's renal function has been normal in the past and there should be no contraindication for IVC filter placement.  Patient says that his surgery time is dependent on how quickly we can place an IVC filter.  Therefore, we will schedule the patient for an IVC filter placement at [facility] or [facility] as soon as possible.  Patient is comfortable with having one of my partners place the IVC filter if necessary.   Thank you for this interesting consult.  I greatly enjoyed meeting NOLON YELLIN and look forward to participating in their care.  A copy of this report was sent to the requesting provider on this date.  Electronically Signed: Burman Riis 09/20/2019, 3:57 PM   I spent a total of    10 Minutes in remote  clinical consultation, greater than 50% of which was counseling/coordinating care for IVC filter placement.    Visit type: Audio only (telephone). Audio (no video) only due to patient preference. Alternative for in-person consultation at [facility], [address], [place], [place]. This visit type was conducted due to national recommendations for restrictions regarding the COVID-19 Pandemic (e.g. social distancing).  This format is felt to be most appropriate for this patient at this time.  All issues noted in this document were discussed and addressed.

## 2019-09-21 ENCOUNTER — Other Ambulatory Visit (HOSPITAL_COMMUNITY): Payer: Self-pay | Admitting: Diagnostic Radiology

## 2019-09-21 DIAGNOSIS — Z86718 Personal history of other venous thrombosis and embolism: Secondary | ICD-10-CM

## 2019-09-26 ENCOUNTER — Other Ambulatory Visit: Payer: Self-pay | Admitting: Student

## 2019-09-27 ENCOUNTER — Encounter (HOSPITAL_COMMUNITY): Payer: Self-pay | Admitting: Interventional Radiology

## 2019-09-27 ENCOUNTER — Ambulatory Visit (HOSPITAL_COMMUNITY)
Admission: RE | Admit: 2019-09-27 | Discharge: 2019-09-27 | Disposition: A | Discharge status: Home / Self Care | Payer: No Typology Code available for payment source | Source: Ambulatory Visit | Attending: Diagnostic Radiology | Admitting: Diagnostic Radiology

## 2019-09-27 ENCOUNTER — Other Ambulatory Visit: Payer: Self-pay

## 2019-09-27 DIAGNOSIS — Z86718 Personal history of other venous thrombosis and embolism: Secondary | ICD-10-CM | POA: Diagnosis present

## 2019-09-27 DIAGNOSIS — Z7901 Long term (current) use of anticoagulants: Secondary | ICD-10-CM | POA: Diagnosis not present

## 2019-09-27 DIAGNOSIS — E7800 Pure hypercholesterolemia, unspecified: Secondary | ICD-10-CM | POA: Insufficient documentation

## 2019-09-27 DIAGNOSIS — Z79899 Other long term (current) drug therapy: Secondary | ICD-10-CM | POA: Diagnosis not present

## 2019-09-27 HISTORY — PX: IR IVC FILTER PLMT / S&I /IMG GUID/MOD SED: IMG701

## 2019-09-27 LAB — BASIC METABOLIC PANEL
Anion gap: 9 (ref 5–15)
BUN: 13 mg/dL (ref 6–20)
CO2: 25 mmol/L (ref 22–32)
Calcium: 9.1 mg/dL (ref 8.9–10.3)
Chloride: 106 mmol/L (ref 98–111)
Creatinine, Ser: 0.9 mg/dL (ref 0.61–1.24)
GFR calc Af Amer: >60 mL/min (ref >60)
GFR calc non Af Amer: >60 mL/min (ref >60)
Glucose, Bld: 97 mg/dL (ref 70–99)
Potassium: 4.2 mmol/L (ref 3.5–5.1)
Sodium: 140 mmol/L (ref 135–145)

## 2019-09-27 LAB — CBC
HCT: 46.8 % (ref 39.0–52.0)
Hemoglobin: 15.1 g/dL (ref 13.0–17.0)
MCH: 29 pg (ref 26.0–34.0)
MCHC: 32.3 g/dL (ref 30.0–36.0)
MCV: 89.8 fL (ref 80.0–100.0)
Platelets: 273 10*3/uL (ref 150–400)
RBC: 5.21 MIL/uL (ref 4.22–5.81)
RDW: 11.9 % (ref 11.5–15.5)
WBC: 6.5 10*3/uL (ref 4.0–10.5)
nRBC: 0 % (ref 0.0–0.2)

## 2019-09-27 LAB — PROTIME-INR
INR: 1.3 — ABNORMAL HIGH (ref 0.8–1.2)
Prothrombin Time: 15.7 seconds — ABNORMAL HIGH (ref 11.4–15.2)

## 2019-09-27 MED ORDER — LIDOCAINE HCL 1 % IJ SOLN
INTRAMUSCULAR | Status: AC | PRN
  Administered 2019-09-27: 5 mL

## 2019-09-27 MED ORDER — LIDOCAINE HCL 1 % IJ SOLN
INTRAMUSCULAR | Status: AC
  Filled 2019-09-27: qty 20

## 2019-09-27 MED ORDER — NALOXONE HCL 0.4 MG/ML IJ SOLN
INTRAMUSCULAR | Status: AC
  Filled 2019-09-27: qty 1

## 2019-09-27 MED ORDER — FENTANYL CITRATE (PF) 100 MCG/2ML IJ SOLN
INTRAMUSCULAR | Status: AC | PRN
  Administered 2019-09-27 (×3): 50 ug via INTRAVENOUS

## 2019-09-27 MED ORDER — FENTANYL CITRATE (PF) 100 MCG/2ML IJ SOLN
INTRAMUSCULAR | Status: AC
  Filled 2019-09-27: qty 4

## 2019-09-27 MED ORDER — IOHEXOL 300 MG/ML  SOLN
100.0000 mL | Freq: Once | INTRAMUSCULAR | Status: AC | PRN
  Administered 2019-09-27: 11:00:00 40 mL via INTRAVENOUS

## 2019-09-27 MED ORDER — SODIUM CHLORIDE 0.9 % IV SOLN: INTRAVENOUS | Status: DC

## 2019-09-27 MED ORDER — MIDAZOLAM HCL 2 MG/2ML IJ SOLN
INTRAMUSCULAR | Status: AC | PRN
  Administered 2019-09-27 (×3): 1 mg via INTRAVENOUS

## 2019-09-27 MED ORDER — MIDAZOLAM HCL 2 MG/2ML IJ SOLN
INTRAMUSCULAR | Status: AC
  Filled 2019-09-27: qty 4

## 2019-09-27 MED ORDER — FLUMAZENIL 0.5 MG/5ML IV SOLN
INTRAVENOUS | Status: AC
  Filled 2019-09-27: qty 5

## 2019-09-27 NOTE — H&P (Signed)
Chief Complaint: Patient was seen in consultation today for DVT  Referring Physician(s): Dr. Rolena Infante  Supervising Physician: Sandi Mariscal  Patient Status: [facility] - [facility]  History of Present Illness: John Guzman is a 58 y.o. male with history of thromboembolic disease with previous RLE DVT and PE.  He is typically on Eliquis due to his coagulopathy, however is now scheduled for upcoming spinal surgery and will need to hold his Eliquis.  Request made for short term IVC filter placement. Of note, he did undergo IVC filter placement 08/17/18 for hip surgery which was tolerated well and removed 11/21/18.   He was recently seen in consultation with Dr. Anselm Pancoast on 09/20/19 to discuss replacement of his IVC filter and was determined to be an appropriate candidate.  He presents for procedure today in his usual state of health.   He has been NPO.  His surgery is to be schedule after filter placement. He last took his Eliquis this AM.     Past Medical History:  Diagnosis Date  . DVT of lower extremity (deep venous thrombosis) ([place]) 09/2017   Right Lower Extremity  . Elevated cholesterol   . Hx of pulmonary embolus 09/2017    Past Surgical History:  Procedure Laterality Date  . ANKLE SURGERY Right 1981   metal in place   . BACK SURGERY  2001  . IR IVC FILTER PLMT / S&I /IMG GUID/MOD SED  08/17/2018  . IR IVC FILTER RETRIEVAL / S&I /IMG GUID/MOD SED  11/21/2018  . IR RADIOLOGIST EVAL & MGMT  08/04/2018  . IR RADIOLOGIST EVAL & MGMT  10/27/2018  . IR RADIOLOGIST EVAL & MGMT  09/20/2019  . LASIK Bilateral 2016  . TOTAL HIP ARTHROPLASTY Right 08/24/2018   Procedure: RIGHT TOTAL HIP ARTHROPLASTY ANTERIOR APPROACH;  Surgeon: Rod Can, MD;  Location: [facility];  Service: Orthopedics;  Laterality: Right;  Needs RNFA    Allergies: Other  Medications: Prior to Admission medications   Medication Sig Start Date End Date Taking? Authorizing Provider  apixaban (ELIQUIS) 5 MG TABS tablet Take 5  mg by mouth 2 (two) times daily.   Yes [provider]  atorvastatin (LIPITOR) 10 MG tablet Take 10 mg by mouth at bedtime.   Yes [provider]     No family history on file.  Social History   Socioeconomic History  . Marital status: Married    Spouse name: Not on file  . Number of children: Not on file  . Years of education: Not on file  . Highest education level: Not on file  Occupational History  . Not on file  Social Needs  . Financial resource strain: Not on file  . Food insecurity    Worry: Not on file    Inability: Not on file  . Transportation needs    Medical: Not on file    Non-medical: Not on file  Tobacco Use  . Smoking status: Never Smoker  . Smokeless tobacco: Never Used  Substance and Sexual Activity  . Alcohol use: Yes    Alcohol/week: 5.0 standard drinks    Types: 5 Cans of beer per week  . Drug use: Never  . Sexual activity: Not on file  Lifestyle  . Physical activity    Days per week: Not on file    Minutes per session: Not on file  . Stress: Not on file  Relationships  . Social Herbalist on phone: Not on file    Gets  together: Not on file    Attends religious service: Not on file    Active member of club or organization: Not on file    Attends meetings of clubs or organizations: Not on file    Relationship status: Not on file  Other Topics Concern  . Not on file  Social History Narrative  . Not on file     Review of Systems: A 12 point ROS discussed and pertinent positives are indicated in the HPI above.  All other systems are negative.  Review of Systems  Constitutional: Negative for fatigue and fever.  Respiratory: Negative for cough and shortness of breath.   Cardiovascular: Negative for chest pain.  Gastrointestinal: Negative for abdominal pain.  Musculoskeletal: Negative for back pain.  Psychiatric/Behavioral: Negative for behavioral problems and confusion.    Vital Signs: BP (!) 141/64   Pulse  75   Temp (!) 97.3 F (36.3 C) (Skin)   Resp 18   Ht 5' 6.5" (1.689 m)   Wt 190 lb (86.2 kg)   SpO2 99%   BMI 30.21 kg/m   Physical Exam Vitals signs and nursing note reviewed.  Constitutional:      General: He is not in acute distress.    Appearance: Normal appearance.  HENT:     Mouth/Throat:     Mouth: Mucous membranes are moist.     Pharynx: Oropharynx is clear.  Cardiovascular:     Rate and Rhythm: Normal rate and regular rhythm.  Pulmonary:     Effort: Pulmonary effort is normal. No respiratory distress.     Breath sounds: Normal breath sounds.  Abdominal:     General: Abdomen is flat.     Palpations: Abdomen is soft.  Skin:    General: Skin is warm and dry.  Neurological:     General: No focal deficit present.     Mental Status: He is alert and oriented to person, place, and time. Mental status is at baseline.  Psychiatric:        Mood and Affect: Mood normal.        Behavior: Behavior normal.        Thought Content: Thought content normal.        Judgment: Judgment normal.      MD Evaluation Airway: WNL Heart: WNL Abdomen: WNL Chest/ Lungs: WNL ASA  Classification: 3 Mallampati/Airway Score: Two   Imaging: Ir Radiologist Eval & Mgmt  Result Date: 09/20/2019 Please refer to notes tab for details about interventional procedure. (Op Note)   Labs:  CBC: Recent Labs    11/21/18 0807 09/27/19 0913  WBC 5.9 6.5  HGB 14.8 15.1  HCT 47.2 46.8  PLT 259 273    COAGS: Recent Labs    11/21/18 0807  INR 0.95    BMP: Recent Labs    11/21/18 0807  NA 139  K 4.0  CL 107  CO2 25  GLUCOSE 102*  BUN 22*  CALCIUM 9.0  CREATININE 0.85  GFRNONAA >60  GFRAA >60    LIVER FUNCTION TESTS: No results for input(s): BILITOT, AST, ALT, ALKPHOS, PROT, ALBUMIN in the last 8760 hours.  TUMOR MARKERS: No results for input(s): AFPTM, CEA, CA199, CHROMGRNA in the last 8760 hours.  Assessment and Plan: Patient with past medical history of DVT, PE  presents in need of IVC filter placement prior to spine surgery.   Case reviewed by Dr. Lowella Dandy who has consulted with the patient and approves patient for procedure.  Patient presents today in  their usual state of health.  He has been NPO. Last took Eliquis this AM.    Risks and benefits discussed with the patient including, but not limited to bleeding, infection, contrast induced renal failure, filter fracture or migration which can lead to emergency surgery or even death, strut penetration with damage or irritation to adjacent structures and caval thrombosis.  All of the patient's questions were answered, patient is agreeable to proceed. Consent signed and in chart.  Thank you for this interesting consult.  I greatly enjoyed meeting John Guzman and look forward to participating in their care.  A copy of this report was sent to the requesting provider on this date.  Electronically Signed: Hoyt KochKacie Sue-Ellen , PA 09/27/2019, 10:00 AM   I spent a total of    10 Minutes in face to face in clinical consultation, greater than 50% of which was counseling/coordinating care for history of DVT, IVC filter placement.

## 2019-09-27 NOTE — Procedures (Signed)
Pre procedural Dx: History of DVT and PE on chronic anticoagulation, Pre-op Filter placement requested Post Procedural Dx: Same  Successful placement of an infrarenal IVC filter via the right IJ vein.  EBL: Trace Complications: None immediate  Ronny Bacon, MD Pager #: [phone]

## 2019-09-27 NOTE — Discharge Instructions (Signed)
Inferior Vena Cava Filter Insertion, Care After °This sheet gives you information about how to care for yourself after your procedure. Your health care provider may also give you more specific instructions. If you have problems or questions, contact your health care provider. °What can I expect after the procedure? °After your procedure, it is common to have: °· Mild pain in the area where the filter was inserted. °· Mild bruising in the area where the filter was inserted. °Follow these instructions at home: °Insertion site care ° °· Follow instructions from your health care provider about how to take care of the site where a catheter was inserted at your neck or groin (insertion site). Make sure you: °? Wash your hands with soap and water before you change your bandage (dressing). If soap and water are not available, use hand sanitizer. °? Change your dressing as told by your health care provider. °· Check your insertion site every day for signs of infection. Check for: °? More redness, swelling, or pain. °? More fluid or blood. °? Warmth. °? Pus or a bad smell. °· Keep the insertion site clean and dry. °· Do not shower, bathe, use a hot tub, or let the dressing get wet until your health care provider approves. °General instructions °· Take over-the-counter and prescription medicines only as told by your health care provider. °· Avoid heavy lifting or hard activities for 48 hours after the procedure or as told by your health care provider. °· Do not drive for 24 hours if you were given a medicine to help you relax (sedative). °· Do not drive or use heavy machinery while taking prescription pain medicine. °· Do not go back to school or work until your health care provider approves. °· Keep all follow-up visits as told by your health care provider. This is important. °Contact a health care provider if: °· You have more redness, swelling, or pain around your insertion site. °· You have more fluid or blood coming from  your insertion site. °· Your insertion site feels warm to the touch. °· You have pus or a bad smell coming from your insertion site. °· You have a fever. °· You are dizzy. °· You have nausea and vomiting. °· You develop a rash. °Get help right away if: °· You develop chest pain, a cough, or difficulty breathing. °· You develop shortness of breath, feel faint, or pass out. °· You cough up blood. °· You have severe pain in your abdomen. °· You develop swelling and discoloration or pain in your legs. °· Your legs become pale and cold or blue. °· You develop weakness, difficulty moving your arms or legs, or balance problems. °· You develop problems with speech or vision. °These symptoms may represent a serious problem that is an emergency. Do not wait to see if the symptoms will go away. Get medical help right away. Call your local emergency services (911 in the [place] Do not drive yourself to the hospital. °Summary °· After your insertion procedure, it is common to have mild pain and bruising. °· Do not shower, bathe, use a hot tub, or let the dressing get wet until your health care provider approves. °· Every day, check for signs of infection where a catheter was inserted at your neck or groin (insertion site). °This information is not intended to replace advice given to you by your health care provider. Make sure you discuss any questions you have with your health care provider. °Document Released: 07/26/2013 Document Revised:   09/17/2017 Document Reviewed: 08/26/2016 °Elsevier Patient Education © 2020 [employer] ° °

## 2019-09-29 ENCOUNTER — Ambulatory Visit: Payer: Self-pay | Admitting: Orthopedic Surgery

## 2019-10-04 ENCOUNTER — Encounter: Payer: Non-veteran care | Admitting: Vascular Surgery

## 2019-10-06 NOTE — Progress Notes (Signed)
[id number] - [place], [place] - [address] [place] [address] Phone: [phone] Fax: [phone]     Your procedure is scheduled on Wednesday, October 11, 2019.   Report to [facility] Main Entrance "A" at 1:45 P.M., and check in at the Admitting office.   Call this number if you have problems the morning of surgery:  [phone]  Call [phone] if you have any questions prior to your surgery date Monday-Friday 8am-4pm    Remember:  Do not eat or drink after midnight the night before your surgery    Take these medicines the morning of surgery with A SIP OF WATER :  Acetaminophen (Tylenol) - if needed Tripolidine-Pseudoephedrine (Wal-Act) - if needed  7 days prior to surgery STOP taking any Aspirin (unless otherwise instructed by your surgeon), Aleve, Naproxen, Ibuprofen, Motrin, Advil, Goody's, BC's, all herbal medications, fish oil, and all vitamins.    The Morning of Surgery  Do not wear jewelry.  Do not wear lotions, powders, colognes, or deodorant  Do not shave 48 hours prior to surgery.  Men may shave face and neck.  Do not bring valuables to the hospital.  [facility] is not responsible for any belongings or valuables.  If you are a smoker, DO NOT Smoke 24 hours prior to surgery  If you wear a CPAP at night please bring your mask, tubing, and machine the morning of surgery   Remember that you must have someone to transport you home after your surgery, and remain with you for 24 hours if you are discharged the same day.   Please bring cases for contacts, glasses, hearing aids, dentures or bridgework because it cannot be worn into surgery.    Leave your suitcase in the car.  After surgery it may be brought to your room.  For patients admitted to the hospital, discharge time will be determined by your treatment team.  Patients discharged the day of surgery will not be allowed to drive home.    Special instructions:    [facility]- Preparing For Surgery  Before surgery, you can play an important role. Because skin is not sterile, your skin needs to be as free of germs as possible. You can reduce the number of germs on your skin by washing with CHG (chlorahexidine gluconate) Soap before surgery.  CHG is an antiseptic cleaner which kills germs and bonds with the skin to continue killing germs even after washing.    Oral Hygiene is also important to reduce your risk of infection.  Remember - BRUSH YOUR TEETH THE MORNING OF SURGERY WITH YOUR REGULAR TOOTHPASTE  Please do not use if you have an allergy to CHG or antibacterial soaps. If your skin becomes reddened/irritated stop using the CHG.  Do not shave (including legs and underarms) for at least 48 hours prior to first CHG shower. It is OK to shave your face.  Please follow these instructions carefully.   1. Shower the NIGHT BEFORE SURGERY and the MORNING OF SURGERY with CHG Soap.   2. If you chose to wash your hair, wash your hair first as usual with your normal shampoo.  3. After you shampoo, rinse your hair and body thoroughly to remove the shampoo.  4. Use CHG as you would any other liquid soap. You can apply CHG directly to the skin and wash gently with a scrungie or a clean washcloth.   5. Apply the CHG Soap to your body ONLY FROM THE  NECK DOWN.  Do not use on open wounds or open sores. Avoid contact with your eyes, ears, mouth and genitals (private parts). Wash Face and genitals (private parts)  with your normal soap.   6. Wash thoroughly, paying special attention to the area where your surgery will be performed.  7. Thoroughly rinse your body with warm water from the neck down.  8. DO NOT shower/wash with your normal soap after using and rinsing off the CHG Soap.  9. Pat yourself dry with a CLEAN TOWEL.  10. Wear CLEAN PAJAMAS to bed the night before surgery, wear comfortable clothes the morning of surgery  11. Place CLEAN SHEETS on your bed  the night of your first shower and DO NOT SLEEP WITH PETS.    Day of Surgery:  Please shower the morning of surgery with the CHG soap Do not apply any deodorants/lotions. Please wear clean clothes to the hospital/surgery center.   Remember to brush your teeth WITH YOUR REGULAR TOOTHPASTE.   Please read over the following fact sheets that you were given.

## 2019-10-09 ENCOUNTER — Ambulatory Visit (HOSPITAL_COMMUNITY)
Admission: RE | Admit: 2019-10-09 | Discharge: 2019-10-09 | Disposition: A | Discharge status: Home / Self Care | Payer: No Typology Code available for payment source | Source: Ambulatory Visit | Attending: Orthopedic Surgery | Admitting: Orthopedic Surgery

## 2019-10-09 ENCOUNTER — Encounter (HOSPITAL_COMMUNITY): Payer: Self-pay

## 2019-10-09 ENCOUNTER — Other Ambulatory Visit: Payer: Self-pay

## 2019-10-09 ENCOUNTER — Encounter (HOSPITAL_COMMUNITY)
Admission: RE | Admit: 2019-10-09 | Discharge: 2019-10-09 | Disposition: A | Discharge status: Home / Self Care | Payer: No Typology Code available for payment source | Source: Ambulatory Visit | Attending: Orthopedic Surgery | Admitting: Orthopedic Surgery

## 2019-10-09 ENCOUNTER — Other Ambulatory Visit (HOSPITAL_COMMUNITY)
Admission: RE | Admit: 2019-10-09 | Discharge: 2019-10-09 | Disposition: A | Discharge status: Home / Self Care | Payer: No Typology Code available for payment source | Source: Ambulatory Visit | Attending: Orthopedic Surgery | Admitting: Orthopedic Surgery

## 2019-10-09 DIAGNOSIS — M461 Sacroiliitis, not elsewhere classified: Secondary | ICD-10-CM | POA: Diagnosis not present

## 2019-10-09 DIAGNOSIS — Z6831 Body mass index (BMI) 31.0-31.9, adult: Secondary | ICD-10-CM | POA: Diagnosis not present

## 2019-10-09 DIAGNOSIS — Z7901 Long term (current) use of anticoagulants: Secondary | ICD-10-CM | POA: Insufficient documentation

## 2019-10-09 DIAGNOSIS — Z79899 Other long term (current) drug therapy: Secondary | ICD-10-CM | POA: Diagnosis not present

## 2019-10-09 DIAGNOSIS — E669 Obesity, unspecified: Secondary | ICD-10-CM | POA: Diagnosis not present

## 2019-10-09 DIAGNOSIS — Z86718 Personal history of other venous thrombosis and embolism: Secondary | ICD-10-CM | POA: Insufficient documentation

## 2019-10-09 DIAGNOSIS — Z86711 Personal history of pulmonary embolism: Secondary | ICD-10-CM | POA: Diagnosis not present

## 2019-10-09 DIAGNOSIS — Z20828 Contact with and (suspected) exposure to other viral communicable diseases: Secondary | ICD-10-CM | POA: Diagnosis not present

## 2019-10-09 DIAGNOSIS — Z01812 Encounter for preprocedural laboratory examination: Secondary | ICD-10-CM | POA: Diagnosis present

## 2019-10-09 DIAGNOSIS — E7800 Pure hypercholesterolemia, unspecified: Secondary | ICD-10-CM | POA: Diagnosis not present

## 2019-10-09 DIAGNOSIS — Z01818 Encounter for other preprocedural examination: Secondary | ICD-10-CM

## 2019-10-09 HISTORY — DX: Headache, unspecified: R51.9

## 2019-10-09 HISTORY — DX: Pneumonia, unspecified organism: J18.9

## 2019-10-09 LAB — URINALYSIS, ROUTINE W REFLEX MICROSCOPIC
Bilirubin Urine: NEGATIVE
Glucose, UA: NEGATIVE mg/dL
Hgb urine dipstick: NEGATIVE
Ketones, ur: NEGATIVE mg/dL
Leukocytes,Ua: NEGATIVE
Nitrite: NEGATIVE
Protein, ur: NEGATIVE mg/dL
Specific Gravity, Urine: 1.012 (ref 1.005–1.030)
pH: 5 (ref 5.0–8.0)

## 2019-10-09 LAB — BASIC METABOLIC PANEL
Anion gap: 11 (ref 5–15)
BUN: 14 mg/dL (ref 6–20)
CO2: 24 mmol/L (ref 22–32)
Calcium: 9.7 mg/dL (ref 8.9–10.3)
Chloride: 104 mmol/L (ref 98–111)
Creatinine, Ser: 0.87 mg/dL (ref 0.61–1.24)
GFR calc Af Amer: >60 mL/min (ref >60)
GFR calc non Af Amer: >60 mL/min (ref >60)
Glucose, Bld: 102 mg/dL — ABNORMAL HIGH (ref 70–99)
Potassium: 4.1 mmol/L (ref 3.5–5.1)
Sodium: 139 mmol/L (ref 135–145)

## 2019-10-09 LAB — CBC
HCT: 50 % (ref 39.0–52.0)
Hemoglobin: 16.4 g/dL (ref 13.0–17.0)
MCH: 29.4 pg (ref 26.0–34.0)
MCHC: 32.8 g/dL (ref 30.0–36.0)
MCV: 89.8 fL (ref 80.0–100.0)
Platelets: 280 10*3/uL (ref 150–400)
RBC: 5.57 MIL/uL (ref 4.22–5.81)
RDW: 11.6 % (ref 11.5–15.5)
WBC: 6.6 10*3/uL (ref 4.0–10.5)
nRBC: 0 % (ref 0.0–0.2)

## 2019-10-09 LAB — TYPE AND SCREEN
ABO/RH(D): A POS
Antibody Screen: NEGATIVE

## 2019-10-09 LAB — PROTIME-INR
INR: 1 (ref 0.8–1.2)
Prothrombin Time: 12.7 seconds (ref 11.4–15.2)

## 2019-10-09 LAB — SURGICAL PCR SCREEN
MRSA, PCR: NEGATIVE
Staphylococcus aureus: NEGATIVE

## 2019-10-09 LAB — SARS CORONAVIRUS 2 (TAT 6-24 HRS): SARS Coronavirus 2: NEGATIVE

## 2019-10-09 LAB — APTT: aPTT: 29 seconds (ref 24–36)

## 2019-10-09 LAB — ABO/RH: ABO/RH(D): A POS

## 2019-10-09 NOTE — Progress Notes (Signed)
PCP - [facility]) Cardiologist - denies   Chest x-ray - 10-09-19 EKG - n/a  SA - yes, does not wear CPAP  Blood Thinner Instructions: Last dose of Eliquis 10-05-19   COVID TEST- Monday, 10-09-19   Anesthesia review: yes, per Dr. order  Patient denies shortness of breath, fever, cough and chest pain at PAT appointment   All instructions explained to the patient, with a verbal understanding of the material. Patient agrees to go over the instructions while at home for a better understanding. Patient also instructed to self quarantine after being tested for COVID-19. The opportunity to ask questions was provided.

## 2019-10-10 ENCOUNTER — Ambulatory Visit: Payer: Self-pay | Admitting: Orthopedic Surgery

## 2019-10-10 NOTE — H&P (Signed)
Subjective:   John Guzman is a pleasant 58 year old male with past medical history significant for DVT/PE who is scheduled for a right SI joint fusion On 10/11/2019 at the surgery center. He has had several months of severe, debilitating right-sided low back/buttock pain and he would like to move forward with surgical intervention. At the recommendation of his hematologist, he has had a IVC filter placed.  Patient Active Problem List   Diagnosis Date Noted  . Avascular necrosis of femoral head, right ([place]) 08/24/2018  . Avascular necrosis of hip, right ([place]) 08/24/2018   Past Medical History:  Diagnosis Date  . DVT of lower extremity (deep venous thrombosis) ([place]) 09/2017   Right Lower Extremity  . Elevated cholesterol   . Headache   . Hx of pulmonary embolus 09/2017  . Pneumonia     Past Surgical History:  Procedure Laterality Date  . ANKLE SURGERY Right 1981   metal in place   . BACK SURGERY  2001  . FINGER SURGERY Left 1976   Left Middle Finger  . IR IVC FILTER PLMT / S&I /IMG GUID/MOD SED  08/17/2018  . IR IVC FILTER PLMT / S&I /IMG GUID/MOD SED  09/27/2019  . IR IVC FILTER RETRIEVAL / S&I /IMG GUID/MOD SED  11/21/2018  . IR RADIOLOGIST EVAL & MGMT  08/04/2018  . IR RADIOLOGIST EVAL & MGMT  10/27/2018  . IR RADIOLOGIST EVAL & MGMT  09/20/2019  . LASIK Bilateral 2016  . TONSILLECTOMY    . TOTAL HIP ARTHROPLASTY Right 08/24/2018   Procedure: RIGHT TOTAL HIP ARTHROPLASTY ANTERIOR APPROACH;  Surgeon: Rod Can, MD;  Location: [facility];  Service: Orthopedics;  Laterality: Right;  Needs RNFA    Current Outpatient Medications  Medication Sig Dispense Refill Last Dose  . acetaminophen (TYLENOL) 500 MG tablet Take 500-1,000 mg by mouth every 6 (six) hours as needed for mild pain.     Marland Kitchen apixaban (ELIQUIS) 5 MG TABS tablet Take 5 mg by mouth 2 (two) times daily.     Marland Kitchen atorvastatin (LIPITOR) 10 MG tablet Take 10 mg by mouth at bedtime.     Marland Kitchen triprolidine-pseudoephedrine (WAL-ACT) 2.5-60 MG  TABS tablet Take 1 tablet by mouth every 6 (six) hours as needed for allergies.      No current facility-administered medications for this visit.   Allergies  Allergen Reactions  . Other Other (See Comments)    Steroids - make pt feel jittery     Social History   Tobacco Use  . Smoking status: Never Smoker  . Smokeless tobacco: Never Used  Substance Use Topics  . Alcohol use: Yes    Alcohol/week: 5.0 standard drinks    Types: 5 Cans of beer per week    No family history on file.  Review of Systems As stated in HPI  Objective:   Clinical exam: Patient is alert and oriented 3. No shortness of breath, chest pain.  Heart: Regular rate and rhythm, no rubs, murmurs, or gallops  Lungs: Clear to auscultation bilaterally, no shortness of breath  Abdomen: Soft and nontender. No rebound tenderness, no loss of bowel and bladder control. Bowel sounds 4  Lumbar spine: Ongoing significant right SI joint pain. Patient reports 80% improvement following the SI injection on the right side. This is beginning to wear off. Patient is also aware of his chronic mid lumbar pain.  Neuro: No focal neurological deficits on exam. Negative nerve root tension signs.  Peripheral vascular: Extremities warm and well perfused  Assessment:  John Guzman is a very pleasant 58 year old gentleman with degenerative lumbar disc disease and significant right SI joint pain. He recently had a diagnostic SI injection and reports greater than 80% relief with the injection. At this point time he has already had formalized physical therapy, activity modifications, and medications. Despite these his quality-of-life continues to deteriorate. At this point we had a long conversation about the degenerative lumbar disc disease and is SI joint pain. I indicated that with a positive diagnostic injection in the improvement in his overall quality-of-life I think the best course of action is addressing the SI joint at this time. After  recovery from this if he continues to have progressive lumbar discogenic pain then we can discuss surgical management of this. But I do not recommend surgery on both the SI joint and the spine simultaneously. My hope is that with relief of his SI joint pain that is overall quality-of-life improves and he can tolerate the lumbar discomfort.  I have shown him the video on SI fusion and I have gone over the surgical procedure in great detail. All of his questions were encouraged and addressed.  Risks and benefits of surgery were discussed with the patient. These include: Infection, bleeding, death, stroke, paralysis, ongoing or worse pain, need for additional surgery, nonunion, mal-position of the screws.   Plan:   Right SI joint fusion  We have also discussed the goals of surgery to include: Goals of surgery: Reduction in pain, and improvement in quality of life.  We have also discussed the post-operative recovery period to include: bathing/showering restrictions, wound healing, activity (and driving) restrictions, medications/pain mangement.  We have also discussed post-operative redflags to include: signs and symptoms of postoperative infection, DVT/PE.  Patient was provided crutches at today's appointment, he will be nonweightbearing on right lower extremity for 6 weeks postoperative. He did express understanding of this.  We did obtain preoperative clearance from his PCP as well as hematologist. Hematology recommended placement of IVC filter which she has hadd done if discontinuation of anticoagulation was necessary. Because he has had IVC filter placed, I will recommend holding Eliquis 5 days prior to surgery and we will restart it 2 days after surgery. Additionally, I have advised him to avoid any anti-inflammatory medications or aspirin for 7 days prior to surgery in 2 weeks postop.  Patient's diastolic blood pressure was elevated today on exam, he states that he is in pain and anxious. He  states that he takes his blood pressure at home on a regular basis it is normally 120s over 70s. I have advised him to keep a journal log of his blood pressures and bring this with him to his preop visit to prove that his blood pressure is well controlled at baseline. We will have him follow-up with his primary care doctor if needed  Follow-up: 2 weeks postoperatively

## 2019-10-10 NOTE — Progress Notes (Signed)
Anesthesia Chart Review:  Case: [id number] Date/Time: 10/11/19 1539   Procedure: SACROILIAC JOINT FUSION (Right ) - 90 mins   Anesthesia type: General   Pre-op diagnosis: Right sacroiliac dysfunction   Location: [facility] OR ROOM 04 / [place] [facility]   Surgeons: Melina Schools, MD      DISCUSSION: Patient is a 58 year old male scheduled for the above procedure.  History includes never smoker, hypercholesterolemia, back surgery (2001), RLE DVT/PE 09/2017 (treated with Eliquis; preoperative IVF filter placed 07/3018 for right THA, removed 11/21/18; ). IVF filter, s/p removal 11/21/18; preoperative IVC filter placed 09/27/19). BMI 31 is consistent with mild obesity.   Last Eliquis 10/09/19.  Preoperative labs acceptable.  10/09/19 COVID-19 test negative.  If no acute changes and I would anticipate that he could proceed as planned.   VS: BP 131/75   Pulse 72   Temp 36.9 C (Oral)   Resp 17   Ht 5\' 7"  (1.702 m)   Wt 90.6 kg   SpO2 98%   BMI 31.29 kg/m   PROVIDERS: [facility]) is listed as PCP. He also have primary care visits with Dineen Kid, MD through [place].   LABS: Labs reviewed: Acceptable for surgery. (all labs ordered are listed, but only abnormal results are displayed)  Labs Reviewed  BASIC METABOLIC PANEL - Abnormal; Notable for the following components:      Result Value   Glucose, Bld 102 (*)    All other components within normal limits  SURGICAL PCR SCREEN  APTT  CBC  PROTIME-INR  URINALYSIS, ROUTINE W REFLEX MICROSCOPIC  TYPE AND SCREEN  ABO/RH     IMAGES: CXR 10/09/19: FINDINGS: The heart size and mediastinal contours are within normal limits. Both lungs are clear. The visualized skeletal structures are unremarkable. IMPRESSION: No acute cardiopulmonary findings.   EKG: Last EKG found was from 06/23/18 (scanned under Media tab, Correspondence 08/24/18) and showed NSR.   CV: N/A  Past Medical History:  Diagnosis Date  .  DVT of lower extremity (deep venous thrombosis) ([place]) 09/2017   Right Lower Extremity  . Elevated cholesterol   . Headache   . Hx of pulmonary embolus 09/2017  . Pneumonia     Past Surgical History:  Procedure Laterality Date  . ANKLE SURGERY Right 1981   metal in place   . BACK SURGERY  2001  . FINGER SURGERY Left 1976   Left Middle Finger  . IR IVC FILTER PLMT / S&I /IMG GUID/MOD SED  08/17/2018  . IR IVC FILTER PLMT / S&I /IMG GUID/MOD SED  09/27/2019  . IR IVC FILTER RETRIEVAL / S&I /IMG GUID/MOD SED  11/21/2018  . IR RADIOLOGIST EVAL & MGMT  08/04/2018  . IR RADIOLOGIST EVAL & MGMT  10/27/2018  . IR RADIOLOGIST EVAL & MGMT  09/20/2019  . LASIK Bilateral 2016  . TONSILLECTOMY    . TOTAL HIP ARTHROPLASTY Right 08/24/2018   Procedure: RIGHT TOTAL HIP ARTHROPLASTY ANTERIOR APPROACH;  Surgeon: Rod Can, MD;  Location: [facility];  Service: Orthopedics;  Laterality: Right;  Needs RNFA    MEDICATIONS: . acetaminophen (TYLENOL) 500 MG tablet  . apixaban (ELIQUIS) 5 MG TABS tablet  . atorvastatin (LIPITOR) 10 MG tablet  . triprolidine-pseudoephedrine (WAL-ACT) 2.5-60 MG TABS tablet   No current facility-administered medications for this encounter.    Myra Gianotti, PA-C Surgical Short Stay/Anesthesiology [facility] Phone [phone] [facility] Phone [phone] 10/10/2019 10:12 AM

## 2019-10-10 NOTE — Anesthesia Preprocedure Evaluation (Addendum)
Anesthesia Evaluation  Patient identified by MRN, date of birth, ID band Patient awake    Reviewed: Allergy & Precautions, NPO status , Patient's Chart, lab work & pertinent test results  Airway Mallampati: II  TM Distance: >3 FB Neck ROM: Full    Dental  (+) Teeth Intact   Pulmonary neg pulmonary ROS,    Pulmonary exam normal        Cardiovascular negative cardio ROS Normal cardiovascular exam     Neuro/Psych  Headaches,    GI/Hepatic negative GI ROS, Neg liver ROS,   Endo/Other  negative endocrine ROSObesity   Renal/GU negative Renal ROS     Musculoskeletal negative musculoskeletal ROS (+)   Abdominal   Peds  Hematology  (+) Blood dyscrasia (eliquis), ,   Anesthesia Other Findings   Reproductive/Obstetrics                            Anesthesia Physical Anesthesia Plan  ASA: III  Anesthesia Plan: General   Post-op Pain Management:    Induction: Intravenous  PONV Risk Score and Plan: 2 and Ondansetron, Dexamethasone, Midazolam and Treatment may vary due to age or medical condition  Airway Management Planned: Oral ETT  Additional Equipment: None  Intra-op Plan:   Post-operative Plan: Extubation in OR  Informed Consent: I have reviewed the patients History and Physical, chart, labs and discussed the procedure including the risks, benefits and alternatives for the proposed anesthesia with the patient or authorized representative who has indicated his/her understanding and acceptance.     Dental advisory given  Plan Discussed with:   Anesthesia Plan Comments: (PAT note written 10/10/2019 by Myra Gianotti, PA-C. )      Anesthesia Quick Evaluation

## 2019-10-11 ENCOUNTER — Ambulatory Visit (HOSPITAL_COMMUNITY)
Admission: RE | Admit: 2019-10-11 | Discharge: 2019-10-11 | Disposition: A | Discharge status: Home / Self Care | Payer: No Typology Code available for payment source | Attending: Orthopedic Surgery | Admitting: Orthopedic Surgery

## 2019-10-11 ENCOUNTER — Ambulatory Visit (HOSPITAL_COMMUNITY): Payer: No Typology Code available for payment source

## 2019-10-11 ENCOUNTER — Ambulatory Visit (HOSPITAL_COMMUNITY): Payer: No Typology Code available for payment source | Admitting: Physician Assistant

## 2019-10-11 ENCOUNTER — Encounter (HOSPITAL_COMMUNITY)
Admission: RE | Disposition: A | Discharge status: Home / Self Care | Payer: Self-pay | Source: Home / Self Care | Attending: Orthopedic Surgery

## 2019-10-11 ENCOUNTER — Encounter (HOSPITAL_COMMUNITY): Payer: Self-pay | Admitting: Orthopedic Surgery

## 2019-10-11 ENCOUNTER — Ambulatory Visit (HOSPITAL_COMMUNITY): Payer: No Typology Code available for payment source | Admitting: Certified Registered Nurse Anesthetist

## 2019-10-11 DIAGNOSIS — M533 Sacrococcygeal disorders, not elsewhere classified: Secondary | ICD-10-CM | POA: Diagnosis present

## 2019-10-11 DIAGNOSIS — Z419 Encounter for procedure for purposes other than remedying health state, unspecified: Secondary | ICD-10-CM

## 2019-10-11 HISTORY — PX: SACROILIAC JOINT FUSION: SHX6088

## 2019-10-11 SURGERY — SACROILIAC JOINT FUSION
Anesthesia: General | Site: Back | Laterality: Right

## 2019-10-11 MED ORDER — OXYCODONE HCL 5 MG PO TABS
ORAL_TABLET | ORAL | Status: AC
  Filled 2019-10-11: qty 1

## 2019-10-11 MED ORDER — LIDOCAINE HCL (CARDIAC) PF 100 MG/5ML IV SOSY
PREFILLED_SYRINGE | INTRAVENOUS | Status: DC | PRN
  Administered 2019-10-11: 100 mg via INTRAVENOUS

## 2019-10-11 MED ORDER — EPINEPHRINE PF 1 MG/ML IJ SOLN
INTRAMUSCULAR | Status: AC
  Filled 2019-10-11: qty 1

## 2019-10-11 MED ORDER — ONDANSETRON HCL 4 MG PO TABS: 4.0000 mg | ORAL_TABLET | Freq: Three times a day (TID) | ORAL | 0 refills | Status: DC | PRN

## 2019-10-11 MED ORDER — BUPIVACAINE LIPOSOME 1.3 % IJ SUSP
20.0000 mL | INTRAMUSCULAR | Status: AC
  Administered 2019-10-11: 16 mL
  Filled 2019-10-11: qty 20

## 2019-10-11 MED ORDER — ONDANSETRON HCL 4 MG/2ML IJ SOLN
INTRAMUSCULAR | Status: DC | PRN
  Administered 2019-10-11: 4 mg via INTRAVENOUS

## 2019-10-11 MED ORDER — OXYCODONE-ACETAMINOPHEN 10-325 MG PO TABS: 1.0000 | ORAL_TABLET | Freq: Four times a day (QID) | ORAL | 0 refills | Status: AC | PRN

## 2019-10-11 MED ORDER — BUPIVACAINE HCL (PF) 0.25 % IJ SOLN
INTRAMUSCULAR | Status: AC
  Filled 2019-10-11: qty 30

## 2019-10-11 MED ORDER — OXYCODONE HCL 5 MG/5ML PO SOLN: 5.0000 mg | Freq: Once | ORAL | Status: AC | PRN

## 2019-10-11 MED ORDER — LACTATED RINGERS IV SOLN: INTRAVENOUS | Status: DC

## 2019-10-11 MED ORDER — MIDAZOLAM HCL 5 MG/5ML IJ SOLN
INTRAMUSCULAR | Status: DC | PRN
  Administered 2019-10-11: 2 mg via INTRAVENOUS

## 2019-10-11 MED ORDER — CEFAZOLIN SODIUM-DEXTROSE 2-4 GM/100ML-% IV SOLN
2.0000 g | INTRAVENOUS | Status: AC
  Administered 2019-10-11: 2 g via INTRAVENOUS
  Filled 2019-10-11: qty 100

## 2019-10-11 MED ORDER — PROPOFOL 10 MG/ML IV BOLUS
INTRAVENOUS | Status: DC | PRN
  Administered 2019-10-11: 200 mg via INTRAVENOUS

## 2019-10-11 MED ORDER — PROPOFOL 10 MG/ML IV BOLUS
INTRAVENOUS | Status: AC
  Filled 2019-10-11: qty 20

## 2019-10-11 MED ORDER — METHOCARBAMOL 500 MG PO TABS: 500.0000 mg | ORAL_TABLET | Freq: Three times a day (TID) | ORAL | 0 refills | Status: AC | PRN

## 2019-10-11 MED ORDER — FENTANYL CITRATE (PF) 250 MCG/5ML IJ SOLN
INTRAMUSCULAR | Status: AC
  Filled 2019-10-11: qty 5

## 2019-10-11 MED ORDER — ONDANSETRON HCL 4 MG/2ML IJ SOLN: 4.0000 mg | Freq: Once | INTRAMUSCULAR | Status: DC | PRN

## 2019-10-11 MED ORDER — BUPIVACAINE-EPINEPHRINE (PF) 0.25% -1:200000 IJ SOLN
INTRAMUSCULAR | Status: AC
  Filled 2019-10-11: qty 20

## 2019-10-11 MED ORDER — THROMBIN (RECOMBINANT) 20000 UNITS EX SOLR
CUTANEOUS | Status: AC
  Filled 2019-10-11: qty 20000

## 2019-10-11 MED ORDER — MIDAZOLAM HCL 2 MG/2ML IJ SOLN
INTRAMUSCULAR | Status: AC
  Filled 2019-10-11: qty 2

## 2019-10-11 MED ORDER — SUGAMMADEX SODIUM 200 MG/2ML IV SOLN
INTRAVENOUS | Status: DC | PRN
  Administered 2019-10-11: 300 mg via INTRAVENOUS

## 2019-10-11 MED ORDER — PHENYLEPHRINE HCL (PRESSORS) 10 MG/ML IV SOLN
INTRAVENOUS | Status: DC | PRN
  Administered 2019-10-11 (×4): 80 ug via INTRAVENOUS

## 2019-10-11 MED ORDER — ROCURONIUM BROMIDE 100 MG/10ML IV SOLN
INTRAVENOUS | Status: DC | PRN
  Administered 2019-10-11: 70 mg via INTRAVENOUS

## 2019-10-11 MED ORDER — FENTANYL CITRATE (PF) 100 MCG/2ML IJ SOLN: 25.0000 ug | INTRAMUSCULAR | Status: DC | PRN

## 2019-10-11 MED ORDER — BUPIVACAINE-EPINEPHRINE 0.25% -1:200000 IJ SOLN
INTRAMUSCULAR | Status: DC | PRN
  Administered 2019-10-11: 16 mL
  Administered 2019-10-11: 5 mL

## 2019-10-11 MED ORDER — FENTANYL CITRATE (PF) 100 MCG/2ML IJ SOLN
INTRAMUSCULAR | Status: DC | PRN
  Administered 2019-10-11 (×2): 100 ug via INTRAVENOUS
  Administered 2019-10-11: 50 ug via INTRAVENOUS

## 2019-10-11 MED ORDER — 0.9 % SODIUM CHLORIDE (POUR BTL) OPTIME
TOPICAL | Status: DC | PRN
  Administered 2019-10-11: 1000 mL

## 2019-10-11 MED ORDER — BUPIVACAINE-EPINEPHRINE (PF) 0.25% -1:200000 IJ SOLN
INTRAMUSCULAR | Status: AC
  Filled 2019-10-11: qty 10

## 2019-10-11 MED ORDER — OXYCODONE HCL 5 MG PO TABS
5.0000 mg | ORAL_TABLET | Freq: Once | ORAL | Status: AC | PRN
  Administered 2019-10-11: 5 mg via ORAL

## 2019-10-11 SURGICAL SUPPLY — 55 items
BLADE CLIPPER SURG (BLADE) IMPLANT
BLADE SURG 11 STRL SS (BLADE) ×3 IMPLANT
CLOSURE STERI-STRIP 1/2X4 (GAUZE/BANDAGES/DRESSINGS) ×1
CLSR STERI-STRIP ANTIMIC 1/2X4 (GAUZE/BANDAGES/DRESSINGS) ×2 IMPLANT
COVER SURGICAL LIGHT HANDLE (MISCELLANEOUS) ×3 IMPLANT
COVER WAND RF STERILE (DRAPES) ×3 IMPLANT
DRAPE C-ARM 42X72 X-RAY (DRAPES) ×3 IMPLANT
DRAPE C-ARMOR (DRAPES) ×3 IMPLANT
DRAPE SURG 17X23 STRL (DRAPES) ×3 IMPLANT
DRAPE U-SHAPE 47X51 STRL (DRAPES) ×3 IMPLANT
DRSG OPSITE POSTOP 3X4 (GAUZE/BANDAGES/DRESSINGS) IMPLANT
DRSG OPSITE POSTOP 4X6 (GAUZE/BANDAGES/DRESSINGS) ×3 IMPLANT
DURAPREP 26ML APPLICATOR (WOUND CARE) ×3 IMPLANT
ELECT BLADE 4.0 EZ CLEAN MEGAD (MISCELLANEOUS) ×3
ELECT PENCIL ROCKER SW 15FT (MISCELLANEOUS) ×3 IMPLANT
ELECT REM PT RETURN 9FT ADLT (ELECTROSURGICAL) ×3
ELECTRODE BLDE 4.0 EZ CLN MEGD (MISCELLANEOUS) ×1 IMPLANT
ELECTRODE REM PT RTRN 9FT ADLT (ELECTROSURGICAL) ×1 IMPLANT
GLOVE BIOGEL PI IND STRL 8.5 (GLOVE) ×1 IMPLANT
GLOVE BIOGEL PI INDICATOR 8.5 (GLOVE) ×2
GLOVE SS BIOGEL STRL SZ 8.5 (GLOVE) ×1 IMPLANT
GLOVE SUPERSENSE BIOGEL SZ 8.5 (GLOVE) ×2
GOWN STRL REUS W/ TWL LRG LVL3 (GOWN DISPOSABLE) ×1 IMPLANT
GOWN STRL REUS W/TWL 2XL LVL3 (GOWN DISPOSABLE) ×3 IMPLANT
GOWN STRL REUS W/TWL LRG LVL3 (GOWN DISPOSABLE) ×2
GUIDE PIN IFUSE DISP 3.2 (PIN) ×9
IMPL IFUSE 3D 7X35 (Rod) ×1 IMPLANT
IMPL IFUSE 7.0X50 (Rod) ×1 IMPLANT
IMPLANT IFUSE 3D 7X35 (Rod) ×3 IMPLANT
IMPLANT IFUSE 7.0MMX40MM (Rod) ×2 IMPLANT
IMPLANT IFUSE 7.0X50 (Rod) ×3 IMPLANT
KIT BASIN OR (CUSTOM PROCEDURE TRAY) ×3 IMPLANT
KIT TURNOVER KIT B (KITS) ×3 IMPLANT
NEEDLE 22X1 1/2 (OR ONLY) (NEEDLE) ×3 IMPLANT
NS IRRIG 1000ML POUR BTL (IV SOLUTION) ×3 IMPLANT
PACK LAMINECTOMY ORTHO (CUSTOM PROCEDURE TRAY) ×3 IMPLANT
PACK UNIVERSAL I (CUSTOM PROCEDURE TRAY) ×3 IMPLANT
PAD ARMBOARD 7.5X6 YLW CONV (MISCELLANEOUS) ×6 IMPLANT
PIN BLUNT IFUSE DISP 3.2 (PIN) ×3 IMPLANT
PIN EXCHANGE IFUSE DISP 3.2 (PIN) ×3 IMPLANT
PIN GUIDE IFUSE DISP 3.2 (PIN) ×3 IMPLANT
POSITIONER HEAD PRONE TRACH (MISCELLANEOUS) ×3 IMPLANT
STAPLER VISISTAT 35W (STAPLE) ×3 IMPLANT
SUT MNCRL AB 3-0 PS2 18 (SUTURE) IMPLANT
SUT VIC AB 1 CT1 18XCR BRD 8 (SUTURE) ×1 IMPLANT
SUT VIC AB 1 CT1 8-18 (SUTURE) ×2
SUT VIC AB 2-0 CT1 18 (SUTURE) ×3 IMPLANT
SYR BULB IRRIGATION 50ML (SYRINGE) ×3 IMPLANT
SYR CONTROL 10ML LL (SYRINGE) ×3 IMPLANT
SYS SPNL FX3ANG 40X7X (Rod) ×1 IMPLANT
SYSTEM SPNL FX3ANG 40X7X (Rod) ×1 IMPLANT
TOWEL GREEN STERILE (TOWEL DISPOSABLE) ×3 IMPLANT
TOWEL GREEN STERILE FF (TOWEL DISPOSABLE) ×3 IMPLANT
WATER STERILE IRR 1000ML POUR (IV SOLUTION) ×3 IMPLANT
YANKAUER SUCT BULB TIP NO VENT (SUCTIONS) ×3 IMPLANT

## 2019-10-11 NOTE — Anesthesia Procedure Notes (Signed)
Procedure Name: Intubation Date/Time: 10/11/2019 3:00 PM Performed by: Nature Kueker T, CRNA Pre-anesthesia Checklist: Patient identified, Emergency Drugs available, Suction available and Patient being monitored Patient Re-evaluated:Patient Re-evaluated prior to induction Oxygen Delivery Method: Circle system utilized Preoxygenation: Pre-oxygenation with 100% oxygen Induction Type: IV induction Ventilation: Mask ventilation without difficulty Laryngoscope Size: Miller and 3 Grade View: Grade I Tube type: Oral Tube size: 7.5 mm Number of attempts: 1 Airway Equipment and Method: Stylet and Oral airway Placement Confirmation: ETT inserted through vocal cords under direct vision,  positive ETCO2 and breath sounds checked- equal and bilateral Secured at: 22 cm Tube secured with: Tape Dental Injury: Teeth and Oropharynx as per pre-operative assessment

## 2019-10-11 NOTE — Transfer of Care (Signed)
Immediate Anesthesia Transfer of Care Note  Patient: John Guzman  Procedure(s) Performed: SACROILIAC JOINT FUSION (Right Back)  Patient Location: PACU  Anesthesia Type:General  Level of Consciousness: awake, alert  and oriented  Airway & Oxygen Therapy: Patient Spontanous Breathing and Patient connected to nasal cannula oxygen  Post-op Assessment: Report given to RN, Post -op Vital signs reviewed and stable and Patient moving all extremities  Post vital signs: Reviewed and stable  Last Vitals:  Vitals Value Taken Time  BP 134/85 10/11/19 1623  Temp    Pulse 85 10/11/19 1626  Resp 17 10/11/19 1626  SpO2 98 % 10/11/19 1626  Vitals shown include unvalidated device data.  Last Pain:  Vitals:   10/11/19 1315  PainSc: 6       Patients Stated Pain Goal: 3 (56/38/75 6433)  Complications: No apparent anesthesia complications

## 2019-10-11 NOTE — H&P (Signed)
Addendum to the H&P.  No change in patient's clinical exam from his last office visit of 10/10/2019.  Plan on moving forward with right sacroiliac fusion for significant SI joint pain that has failed to improve with conservative modalities.  I have gone over the surgical procedure as well as the risks and benefits with the patient and he is again expressed an understanding and willingness to move forward with the surgery.

## 2019-10-11 NOTE — Discharge Instructions (Signed)
Sacro-Iliac Fusion This sheet gives you information about how to care for yourself after your procedure. Your health care provider may also give you more specific instructions. If you have problems or questions, contact your health care provider. What can I expect after the procedure? After the procedure, it is common to have:  Back pain and stiffness.  Pain in the incision area.   Follow these instructions at home: Medicines  Take over-the-counter and prescription medicines only as told by your health care provider. These include any pain medicines or blood thinning medicines (anticoagulants).  If you were prescribed an antibiotic medicine, take it as told by your health care provider. Do not stop taking the antibiotic even if you start to feel better.  Do not drive for 24 hours if you received a medicine to help you relax (sedative).  Do not drive or use heavy machinery while taking prescription pain medicine.  REMEMBER: CRUTCHES - DO NOT BEAR WEIGHT ON EFFECTED SIDE  If you have a brace:  Wear the brace as told by your health care provider. Remove it only as told by your health care provider.  Keep the brace clean. Managing pain, stiffness, and swelling  If directed, put ice on the injured area: ? If you have a removable brace, remove it as told by your health care provider. ? Put ice in a plastic bag. ? Place a towel between your skin and the bag. ? Leave the ice on for 20 minutes, 2-3 times a day. Incision care  Follow instructions from your health care provider about how to take care of your incision. Make sure you: ? Wash your hands with soap and water before you change your bandage (dressing). If soap and water are not available, use hand sanitizer. ? Change your dressing as told by your health care provider. ? Leave stitches (sutures), skin glue, or adhesive strips in place. These skin closures may need to be in place for 2 weeks or longer. If adhesive strip edges  start to loosen and curl up, you may trim the loose edges. Do not remove adhesive strips completely unless your health care provider tells you to do that.  Keep your incision clean and dry. Do not take baths, swim, or use a hot tub until your health care provider approves.    Check your incision area every day for signs of infection. Check for: ? More redness, swelling, or pain. ? Fluid or blood. ? Warmth. ? Pus or a bad smell. Physical activity  Rest and protect your back as much as possible.  Follow instructions from your health care provider about how to move and use good posture to help your spine heal.  Do not lift anything that is heavier than 8 lb (3.6 kg) or as told by your health care provider.  Do not twist or bend at the waist until your health care provider approves.  Avoid: ? Pushing and pulling motions. ? Lifting anything over your head. ? Sitting or lying down in the same position for long periods of time.  Do not exercise until your health care provider approves. Once your health care provider has approved exercise, ask him or her what kinds of exercises you can do to make your back stronger (physical therapy).  Do not put weight on effected side  Crutches for ambulation Bathing  Do not take baths, swim, or use a hot tub for 6 weeks, or until your incision has healed completely.  If your health care provider  approves, you may take showers after your dressing has been removed.  Ok to shower in 5 days General instructions  Wear compression stockings and walk at least every few hours as told by your health care provider. Doing this will help to prevent blood clots and reduce swelling in your legs.  Do not use any products that contain nicotine or tobacco, such as cigarettes and e-cigarettes. These can delay bone healing. If you need help quitting, ask your health care provider.  To prevent or treat constipation while you are taking prescription pain medicine,  your health care provider may recommend that you: ? Drink enough fluid to keep your urine clear or pale yellow. ? Take over-the-counter or prescription medicines. ? Eat foods that are high in fiber, such as fresh fruits and vegetables, whole grains, and beans. ? Limit foods that are high in fat and processed sugars, such as fried and sweet foods.  Keep all follow-up visits as told by your health care provider. This is important. Contact a health care provider if:  You have pain that gets worse or does not get better with medicine.  Your legs or feet become painful or swollen.  You have more redness, swelling, or pain around your incision.  You have fluid or blood coming from your incision.  Your incision feels warm to the touch.  You have pus or a bad smell coming from your incision.  You have a fever.  You vomit or feel nausea.  You have weakness or numbness in your legs that is new or getting worse.  You have trouble controlling urination or bowel movements. Get help right away if:  You have severe pain.  You have chest pain.  You have trouble breathing.  You develop a cough. These symptoms may represent a serious problem that is an emergency. Do not wait to see if the symptoms will go away. Get medical help right away. Call your local emergency services (911 in the [place] Do not drive yourself to the hospital. Summary  It is common to have pain at the back and incision area.  Icing and pain medicines may help to control the pain. Follow directions from your health care provider.  Rest and protect your back as much as possible. Do not twist or bend at the waist. Get up and walk at least every few hours as told by your health care provider. This information is not intended to replace advice given to you by your health care provider. Make sure you discuss any questions you have with your health care provider.  Crutch Mobility: Non-Weight Bearing    Keep weight off  right leg. Grasp crutch handles securely. Do NOT lean armpits on crutches. 1.Move both crutches forward and slightly out to sides. 2.Hop up to crutches with left leg. Repeat above sequence for each step taken.   OK TO RESTART ANTI-COAGULATION (ELIQUIS) ON Friday 10/13/19.

## 2019-10-11 NOTE — Brief Op Note (Signed)
10/11/2019  4:04 PM  PATIENT:  John Guzman  58 y.o. male  PRE-OPERATIVE DIAGNOSIS:  Right sacroiliac dysfunction  POST-OPERATIVE DIAGNOSIS:  Right sacroiliac dysfunction  PROCEDURE:  Procedure(s) with comments: SACROILIAC JOINT FUSION (Right) - 90 mins  SURGEON:  Surgeon(s) and Role:    Melina Schools, MD - Primary  PHYSICIAN ASSISTANT:   ASSISTANTS: Amanda Ward, PA   ANESTHESIA:   general  EBL: minimal   BLOOD ADMINISTERED:none  DRAINS: none   LOCAL MEDICATIONS USED:  MARCAINE    and OTHER exparel  SPECIMEN:  No Specimen  DISPOSITION OF SPECIMEN:  N/A  COUNTS:  YES  TOURNIQUET:  * No tourniquets in log *  DICTATION: .Dragon Dictation  PLAN OF CARE: Discharge to home after PACU  PATIENT DISPOSITION:  PACU - hemodynamically stable.

## 2019-10-11 NOTE — Op Note (Signed)
Operative report  Preoperative diagnosis: Right sacroiliac dysfunction  Postoperative diagnosis: Same  Operative procedure: Right sacroiliac fusion  First Assistant: Cleta Alberts, PA  Implants: I fuse 3D implants.  Top: 50 mm.  Middle: 40 mm.  Bottom: 35 mm  Complications: None  Indications: John Guzman is a very pleasant 58 year old gentleman who presents with significant right SI joint pain and loss of quality of life.  Patient had a positive diagnostic injection but unfortunately failed to get significant long-term positive relief and improvement in quality of life with conservative treatment.  As a result we elected to move forward with surgical intervention.  All appropriate risks benefits and alternatives were discussed with the patient and consent was obtained.  Operative report patient was brought to the operating room placed by the operating room table.  After successful induction of general anesthesia and endotracheal ovation teds SCDs were applied and he was turned prone onto a chest and pelvic roll.  He was secured to the table and the right lateral hip region was prepped and draped in a standard fashion.  Timeout was taken to confirm patient procedure and all other important data.  Using lateral fluoroscopy I outlined the anatomical borders of the sacrum.  I then made a small stab incision and advanced the percutaneous pin to the lateral aspect of the iliac wing.  I confirmed satisfactory position in the lateral plane and then advanced the guidepin into the iliac wing.  I then used the inlet and outlet view to guide the pin across the sacroiliac joint and into the sacrum.  I made sure that the tip of the pin was lateral to the S1 foramen, and was within the anatomical borders of the sacrum and had not violated the pelvis.  Once I confirmed in all 3 views at the properly position I then used the guide to place the second pin.  I again used the same technique starting in the lateral plane advancing  the guidepin into the iliac wing and then using the inlet and outlet views to guide across the sacroiliac joint into the sacrum.  Again care was taken to make sure that I remained lateral to the foramen.  I repeated this in place the third and final pin.  Once I confirmed satisfactory positioning of all 3 guide pins I then made an incision connecting all 3 guide pins.  I then dilated the soft tissue over the guidepin and then placed the targeting device.  I measured and then placed approach and broached across the iliac wing and into the sacrum.  I then placed the I fuse device across the iliac wing across the sacroiliac joint into the sacrum.  Once all 3 I fuse devices were inserted I remove the guidepins and then gently ran my finger over the lateral table of the iliac wing to ensure that they were not proud.  I then confirmed in the lateral inlet and outlet views that all 3 devices were across the sacroiliac joint and well seated into the sacrum and were properly positioned.  The wound was copiously irrigated with normal saline and then for postoperative analgesia we inserted quarter percent Marcaine with Exparel in the soft tissue and on the periosteum.  The wound was closed in a layered fashion with interrupted #1 Vicryl suture, 2-0 Vicryl suture, and 3-0 Monocryl for the skin.  Steri-Strips and dry dressings were applied and the patient was ultimately extubated transfer the PACU without incident.  The end of the case all needle sponge  counts were correct.  There were no adverse intraoperative events.

## 2019-10-16 ENCOUNTER — Encounter: Payer: Self-pay | Admitting: *Deleted

## 2019-10-17 NOTE — Anesthesia Postprocedure Evaluation (Signed)
Anesthesia Post Note  Patient: John Guzman  Procedure(s) Performed: SACROILIAC JOINT FUSION (Right Back)     Patient location during evaluation: PACU Anesthesia Type: General Level of consciousness: awake and alert Pain management: pain level controlled Vital Signs Assessment: post-procedure vital signs reviewed and stable Respiratory status: spontaneous breathing, nonlabored ventilation and respiratory function stable Cardiovascular status: blood pressure returned to baseline and stable Postop Assessment: no apparent nausea or vomiting Anesthetic complications: no    Last Vitals:  Vitals:   10/11/19 1638 10/11/19 1653  BP: 122/78 (!) 131/98  Pulse: 84 88  Resp: 14 16  Temp:    SpO2: 96% 98%    Last Pain:  Vitals:   10/11/19 1644  PainSc: 5    Pain Goal: Patients Stated Pain Goal: 4 (10/11/19 1644)                 Lidia Collum

## 2019-11-24 ENCOUNTER — Other Ambulatory Visit: Payer: Self-pay | Admitting: Diagnostic Radiology

## 2019-11-24 DIAGNOSIS — Z95828 Presence of other vascular implants and grafts: Secondary | ICD-10-CM

## 2019-12-20 ENCOUNTER — Ambulatory Visit
Admission: RE | Admit: 2019-12-20 | Discharge: 2019-12-20 | Disposition: A | Discharge status: Home / Self Care | Payer: Non-veteran care | Source: Ambulatory Visit | Attending: Diagnostic Radiology | Admitting: Diagnostic Radiology

## 2019-12-20 ENCOUNTER — Ambulatory Visit
Admission: RE | Admit: 2019-12-20 | Discharge: 2019-12-20 | Disposition: A | Discharge status: Home / Self Care | Payer: No Typology Code available for payment source | Source: Ambulatory Visit | Attending: Diagnostic Radiology | Admitting: Diagnostic Radiology

## 2019-12-20 ENCOUNTER — Other Ambulatory Visit (HOSPITAL_COMMUNITY): Payer: Self-pay | Admitting: Diagnostic Radiology

## 2019-12-20 ENCOUNTER — Encounter: Payer: Self-pay | Admitting: Radiology

## 2019-12-20 DIAGNOSIS — Z95828 Presence of other vascular implants and grafts: Secondary | ICD-10-CM

## 2019-12-20 HISTORY — PX: IR RADIOLOGIST EVAL & MGMT: IMG5224

## 2019-12-20 NOTE — Progress Notes (Signed)
Chief Complaint: Patient was seen in consultation today for IVC filter management  Referring Physician(s): Venita Lick  History of Present Illness: John Guzman is a 59 y.o. 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  Patient had a prophylactic IVC filter placed prior to his right sacral fusion.  Surgical procedure was performed on 10/11/2019.  Patient restarted his Eliquis shortly after the surgical procedure.  He has been going to rehab.  He says that the back and hip pain has decreased since his surgery.  He is using a cane to ambulate.  Patient had a lower extremity venous duplex today which was negative for lower extremity DVT.  He reports occasional right leg swelling that improves following elevation.  Past Medical History:  Diagnosis Date  . DVT of lower extremity (deep venous thrombosis) (HCC) 09/2017   Right Lower Extremity  . Elevated cholesterol   . Headache   . Hx of pulmonary embolus 09/2017  . Pneumonia     Past Surgical History:  Procedure Laterality Date  . ANKLE SURGERY Right 1981   metal in place   . BACK SURGERY  2001  . FINGER SURGERY Left 1976   Left Middle Finger  . IR IVC FILTER PLMT / S&I /IMG GUID/MOD SED  08/17/2018  . IR IVC FILTER PLMT / S&I /IMG GUID/MOD SED  09/27/2019  . IR IVC FILTER RETRIEVAL / S&I /IMG GUID/MOD SED  11/21/2018  . IR RADIOLOGIST EVAL & MGMT  08/04/2018  . IR RADIOLOGIST EVAL & MGMT  10/27/2018  . IR RADIOLOGIST EVAL & MGMT  09/20/2019  . IR RADIOLOGIST EVAL & MGMT  12/20/2019  . LASIK Bilateral 2016  . SACROILIAC JOINT FUSION Right 10/11/2019   Procedure: SACROILIAC JOINT FUSION;  Surgeon: Venita Lick, MD;  Location: [facility];  Service: Orthopedics;  Laterality: Right;  90 mins  . TONSILLECTOMY    . TOTAL HIP ARTHROPLASTY Right 08/24/2018   Procedure: RIGHT TOTAL HIP ARTHROPLASTY ANTERIOR APPROACH;  Surgeon: Samson Frederic, MD;  Location: [facility] ORS;  Service:  Orthopedics;  Laterality: Right;  Needs RNFA    Allergies: Other  Medications: Prior to Admission medications   Medication Sig Start Date End Date Taking? Authorizing Provider  apixaban (ELIQUIS) 5 MG TABS tablet Take 5 mg by mouth 2 (two) times daily.    [provider]  atorvastatin (LIPITOR) 10 MG tablet Take 10 mg by mouth at bedtime.    [provider]  ondansetron (ZOFRAN) 4 MG tablet Take 1 tablet (4 mg total) by mouth every 8 (eight) hours as needed for nausea or vomiting. 10/11/19   Venita Lick, MD  triprolidine-pseudoephedrine (WAL-ACT) 2.5-60 MG TABS tablet Take 1 tablet by mouth every 6 (six) hours as needed for allergies.    [provider]     No family history on file.  Social History   Socioeconomic History  . Marital status: Married    Spouse name: Not on file  . Number of children: Not on file  . Years of education: Not on file  . Highest education level: Not on file  Occupational History  . Not on file  Tobacco Use  . Smoking status: Never Smoker  . Smokeless tobacco: Never Used  Substance and Sexual Activity  . Alcohol use: Yes    Alcohol/week: 5.0 standard drinks    Types: 5 Cans of beer per week  . Drug use: Never  . Sexual activity: Not on file  Other Topics Concern  . Not on file  Social History Narrative  . Not on file   Social Determinants of Health   Financial Resource Strain:   . Difficulty of Paying Living Expenses: Not on file  Food Insecurity:   . Worried About Charity fundraiser in the Last Year: Not on file  . Ran Out of Food in the Last Year: Not on file  Transportation Needs:   . Lack of Transportation (Medical): Not on file  . Lack of Transportation (Non-Medical): Not on file  Physical Activity:   . Days of Exercise per Week: Not on file  . Minutes of Exercise per Session: Not on file  Stress:   . Feeling of Stress : Not on file  Social Connections:   . Frequency of Communication with Friends  and Family: Not on file  . Frequency of Social Gatherings with Friends and Family: Not on file  . Attends Religious Services: Not on file  . Active Member of Clubs or Organizations: Not on file  . Attends Archivist Meetings: Not on file  . Marital Status: Not on file    Review of Systems  Constitutional: Negative.   Respiratory: Negative.   Cardiovascular: Negative.   Musculoskeletal: Positive for back pain.    Vital Signs: There were no vitals taken for this visit.  Physical Exam Constitutional:      Appearance: He is not ill-appearing.  Cardiovascular:     Rate and Rhythm: Normal rate and regular rhythm.  Pulmonary:     Effort: Pulmonary effort is normal.     Breath sounds: Normal breath sounds.  Abdominal:     General: Abdomen is flat.     Palpations: Abdomen is soft.  Musculoskeletal:        General: No swelling.        Imaging: US Venous Img Lower Bilateral (DVT)  Result Date: 12/20/2019 CLINICAL DATA:  59 year old with history of right sacroiliac fusion. Patient had an IVC filter placed prior to the surgery. Patient is being evaluated for IVC filter retrieval. EXAM: BILATERAL LOWER EXTREMITY VENOUS DOPPLER ULTRASOUND TECHNIQUE: Gray-scale sonography with graded compression, as well as color Doppler and duplex ultrasound were performed to evaluate the lower extremity deep venous systems from the level of the common femoral vein and including the common femoral, femoral, profunda femoral, popliteal and calf veins including the posterior tibial, peroneal and gastrocnemius veins when visible. The superficial great saphenous vein was also interrogated. Spectral Doppler was utilized to evaluate flow at rest and with distal augmentation maneuvers in the common femoral, femoral and popliteal veins. COMPARISON:  10/27/2018 FINDINGS: RIGHT LOWER EXTREMITY Common Femoral Vein: No evidence of thrombus. Normal compressibility, respiratory phasicity and response to  augmentation. Saphenofemoral Junction: No evidence of thrombus. Normal compressibility and flow on color Doppler imaging. Profunda Femoral Vein: No evidence of thrombus. Normal compressibility and flow on color Doppler imaging. Femoral Vein: No evidence of thrombus. Normal compressibility, respiratory phasicity and response to augmentation. Popliteal Vein: No evidence of thrombus. Normal compressibility, respiratory phasicity and response to augmentation. Calf Veins: No evidence of thrombus. Normal compressibility and flow on color Doppler imaging. Superficial Great Saphenous Vein: No evidence of thrombus. Normal compressibility. Other Findings:  None. LEFT LOWER EXTREMITY Common Femoral Vein: No evidence of thrombus. Normal compressibility, respiratory phasicity and response to augmentation. Saphenofemoral Junction: No evidence of thrombus. Normal compressibility and flow on color Doppler imaging. Profunda Femoral Vein: No evidence of thrombus. Normal compressibility and flow on color  Doppler imaging. Femoral Vein: No evidence of thrombus. Normal compressibility, respiratory phasicity and response to augmentation. Popliteal Vein: No evidence of thrombus. Normal compressibility, respiratory phasicity and response to augmentation. Calf Veins: No evidence of thrombus. Normal compressibility and flow on color Doppler imaging. Superficial Great Saphenous Vein: No evidence of thrombus. Normal compressibility. Other Findings:  None. IMPRESSION: No evidence of deep venous thrombosis in either lower extremity. Electronically Signed   By: Richarda Overlie M.D.   On: 12/20/2019 12:20   IR Radiologist Eval & Mgmt  Result Date: 12/20/2019 Please refer to notes tab for details about interventional procedure. (Op Note)   Labs:  CBC: Recent Labs    09/27/19 0913 10/09/19 0942  WBC 6.5 6.6  HGB 15.1 16.4  HCT 46.8 50.0  PLT 273 280    COAGS: Recent Labs    09/27/19 0913 10/09/19 0942  INR 1.3* 1.0  APTT  --  29      BMP: Recent Labs    09/27/19 0913 10/09/19 0942  NA 140 139  K 4.2 4.1  CL 106 104  CO2 25 24  GLUCOSE 97 102*  BUN 13 14  CALCIUM 9.1 9.7  CREATININE 0.90 0.87  GFRNONAA >60 >60  GFRAA >60 >60    LIVER FUNCTION TESTS: No results for input(s): BILITOT, AST, ALT, ALKPHOS, PROT, ALBUMIN in the last 8760 hours.  TUMOR MARKERS: No results for input(s): AFPTM, CEA, CA199, CHROMGRNA in the last 8760 hours.  Assessment and Plan:  59 year old with history of thromboembolic disease and on chronic anticoagulation.  Patient had an IVC filter placed prior to his sacral fusion procedure.  Patient is back on his anticoagulation and no longer needs the IVC filter.  No evidence for DVT on lower extremity venous duplex.  Patient is familiar with IVC filter and IVC filter retrieval because he had same procedures performed in 2019.  We discussed IVC filter retrieval.  Explained that the patient could stay on his Eliquis for the procedure.  Patient would like to have the filter removed soon as possible.  We will schedule an IVC filter retrieval in the near future.  Patient reports occasional right leg swelling.  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    Electronically Signed: Arn Medal 12/20/2019, 1:19 PM   I spent a total of    10 Minutes in face to face in clinical consultation, greater than 50% of which was counseling/coordinating care for IVC filter management patient ID: RILLEY STASH, male   DOB: 09/01/1961, 59 y.o.   MRN: [id number]

## 2019-12-26 ENCOUNTER — Other Ambulatory Visit: Payer: Self-pay | Admitting: Student

## 2019-12-26 ENCOUNTER — Other Ambulatory Visit: Payer: Self-pay | Admitting: Radiology

## 2019-12-27 ENCOUNTER — Encounter (HOSPITAL_COMMUNITY): Payer: Self-pay

## 2019-12-27 ENCOUNTER — Other Ambulatory Visit: Payer: Self-pay

## 2019-12-27 ENCOUNTER — Ambulatory Visit (HOSPITAL_COMMUNITY)
Admission: RE | Admit: 2019-12-27 | Discharge: 2019-12-27 | Disposition: A | Discharge status: Home / Self Care | Payer: No Typology Code available for payment source | Source: Ambulatory Visit | Attending: Diagnostic Radiology | Admitting: Diagnostic Radiology

## 2019-12-27 DIAGNOSIS — Z452 Encounter for adjustment and management of vascular access device: Secondary | ICD-10-CM | POA: Diagnosis not present

## 2019-12-27 DIAGNOSIS — E7800 Pure hypercholesterolemia, unspecified: Secondary | ICD-10-CM | POA: Diagnosis not present

## 2019-12-27 DIAGNOSIS — Z79899 Other long term (current) drug therapy: Secondary | ICD-10-CM | POA: Insufficient documentation

## 2019-12-27 DIAGNOSIS — Z7901 Long term (current) use of anticoagulants: Secondary | ICD-10-CM | POA: Diagnosis not present

## 2019-12-27 DIAGNOSIS — Z86718 Personal history of other venous thrombosis and embolism: Secondary | ICD-10-CM | POA: Diagnosis not present

## 2019-12-27 DIAGNOSIS — Z86711 Personal history of pulmonary embolism: Secondary | ICD-10-CM | POA: Insufficient documentation

## 2019-12-27 DIAGNOSIS — Z95828 Presence of other vascular implants and grafts: Secondary | ICD-10-CM

## 2019-12-27 HISTORY — PX: IR IVC FILTER RETRIEVAL / S&I /IMG GUID/MOD SED: IMG5308

## 2019-12-27 MED ORDER — SODIUM CHLORIDE 0.9 % IV SOLN: INTRAVENOUS | Status: DC

## 2019-12-27 MED ORDER — LIDOCAINE HCL (PF) 1 % IJ SOLN
INTRAMUSCULAR | Status: AC | PRN
  Administered 2019-12-27: 10 mL

## 2019-12-27 MED ORDER — FENTANYL CITRATE (PF) 100 MCG/2ML IJ SOLN
INTRAMUSCULAR | Status: AC
  Filled 2019-12-27: qty 2

## 2019-12-27 MED ORDER — SODIUM CHLORIDE 0.9 % IV SOLN
INTRAVENOUS | Status: AC | PRN
  Administered 2019-12-27: 10 mL/h via INTRAVENOUS

## 2019-12-27 MED ORDER — MIDAZOLAM HCL 2 MG/2ML IJ SOLN
INTRAMUSCULAR | Status: AC
  Filled 2019-12-27: qty 2

## 2019-12-27 MED ORDER — FENTANYL CITRATE (PF) 100 MCG/2ML IJ SOLN
INTRAMUSCULAR | Status: AC | PRN
  Administered 2019-12-27: 50 ug via INTRAVENOUS

## 2019-12-27 MED ORDER — HYDROCODONE-ACETAMINOPHEN 5-325 MG PO TABS: 1.0000 | ORAL_TABLET | ORAL | Status: DC | PRN

## 2019-12-27 MED ORDER — LIDOCAINE HCL 1 % IJ SOLN
INTRAMUSCULAR | Status: AC
  Filled 2019-12-27: qty 20

## 2019-12-27 MED ORDER — IOHEXOL 300 MG/ML  SOLN
150.0000 mL | Freq: Once | INTRAMUSCULAR | Status: AC | PRN
  Administered 2019-12-27: 45 mL via INTRAVENOUS

## 2019-12-27 MED ORDER — MIDAZOLAM HCL 2 MG/2ML IJ SOLN
INTRAMUSCULAR | Status: AC | PRN
  Administered 2019-12-27: 1 mg via INTRAVENOUS

## 2019-12-27 NOTE — H&P (Signed)
Chief Complaint: Patient was seen in consultation today for IVC filter retrieval    Supervising Physician: Richarda Overlie  Patient Status: [facility] - Out-pt  History of Present Illness: John Guzman is a 59 y.o. male scheduled for IVC filter retrieval. Seen by Dr. Lowella Dandy a few days ago after having negative LE duplex. Se Dr. Lowella Dandy consult from 3/3. He has had this before in the past. PMHx, meds, labs, imaging, allergies reviewed. Feels well, no recent fevers, chills, illness. Has been NPO today as directed.   Past Medical History:  Diagnosis Date  . DVT of lower extremity (deep venous thrombosis) (HCC) 09/2017   Right Lower Extremity  . Elevated cholesterol   . Headache   . Hx of pulmonary embolus 09/2017  . Pneumonia     Past Surgical History:  Procedure Laterality Date  . ANKLE SURGERY Right 1981   metal in place   . BACK SURGERY  2001  . FINGER SURGERY Left 1976   Left Middle Finger  . IR IVC FILTER PLMT / S&I /IMG GUID/MOD SED  08/17/2018  . IR IVC FILTER PLMT / S&I /IMG GUID/MOD SED  09/27/2019  . IR IVC FILTER RETRIEVAL / S&I /IMG GUID/MOD SED  11/21/2018  . IR RADIOLOGIST EVAL & MGMT  08/04/2018  . IR RADIOLOGIST EVAL & MGMT  10/27/2018  . IR RADIOLOGIST EVAL & MGMT  09/20/2019  . IR RADIOLOGIST EVAL & MGMT  12/20/2019  . LASIK Bilateral 2016  . SACROILIAC JOINT FUSION Right 10/11/2019   Procedure: SACROILIAC JOINT FUSION;  Surgeon: Venita Lick, MD;  Location: [facility] OR;  Service: Orthopedics;  Laterality: Right;  90 mins  . TONSILLECTOMY    . TOTAL HIP ARTHROPLASTY Right 08/24/2018   Procedure: RIGHT TOTAL HIP ARTHROPLASTY ANTERIOR APPROACH;  Surgeon: Samson Frederic, MD;  Location: [facility] ORS;  Service: Orthopedics;  Laterality: Right;  Needs RNFA    Allergies: Other  Medications: Prior to Admission medications   Medication Sig Start Date End Date Taking? Authorizing Provider  apixaban (ELIQUIS) 5 MG TABS tablet Take 5 mg by mouth 2 (two) times daily.   Yes [provider]  atorvastatin (LIPITOR) 10 MG tablet Take 10 mg by mouth at bedtime.   Yes [provider]  cholecalciferol (VITAMIN D3) 25 MCG (1000 UNIT) tablet Take 1,000 Units by mouth once a week.   Yes [provider]  Psyllium (METAMUCIL PO) Take 1 Dose by mouth 3 (three) times a week.   Yes [provider]  ondansetron (ZOFRAN) 4 MG tablet Take 1 tablet (4 mg total) by mouth every 8 (eight) hours as needed for nausea or vomiting. Patient not taking: Reported on 12/21/2019 10/11/19   Venita Lick, MD  triprolidine-pseudoephedrine (WAL-ACT) 2.5-60 MG TABS tablet Take 1 tablet by mouth every 6 (six) hours as needed for allergies.    [provider]     History reviewed. No pertinent family history.  Social History   Socioeconomic History  . Marital status: Married    Spouse name: Not on file  . Number of children: Not on file  . Years of education: Not on file  . Highest education level: Not on file  Occupational History  . Not on file  Tobacco Use  . Smoking status: Never Smoker  . Smokeless tobacco: Never Used  Substance and Sexual Activity  . Alcohol use: Yes    Alcohol/week: 5.0 standard drinks    Types: 5 Cans of beer per week  . Drug use:  Never  . Sexual activity: Not on file  Other Topics Concern  . Not on file  Social History Narrative  . Not on file   Social Determinants of Health   Financial Resource Strain:   . Difficulty of Paying Living Expenses: Not on file  Food Insecurity:   . Worried About Charity fundraiser in the Last Year: Not on file  . Ran Out of Food in the Last Year: Not on file  Transportation Needs:   . Lack of Transportation (Medical): Not on file  . Lack of Transportation (Non-Medical): Not on file  Physical Activity:   . Days of Exercise per Week: Not on file  . Minutes of Exercise per Session: Not on file  Stress:   . Feeling of Stress : Not on file  Social Connections:   . Frequency of  Communication with Friends and Family: Not on file  . Frequency of Social Gatherings with Friends and Family: Not on file  . Attends Religious Services: Not on file  . Active Member of Clubs or Organizations: Not on file  . Attends Archivist Meetings: Not on file  . Marital Status: Not on file     Review of Systems: A 12 point ROS discussed and pertinent positives are indicated in the HPI above.  All other systems are negative.  Review of Systems  Vital Signs: There were no vitals taken for this visit.  Physical Exam Constitutional:      Appearance: Normal appearance. He is not ill-appearing.  HENT:     Mouth/Throat:     Mouth: Mucous membranes are moist.     Pharynx: Oropharynx is clear.  Cardiovascular:     Rate and Rhythm: Normal rate and regular rhythm.     Heart sounds: Normal heart sounds.  Pulmonary:     Effort: Pulmonary effort is normal. No respiratory distress.     Breath sounds: Normal breath sounds.  Musculoskeletal:     Cervical back: Normal range of motion.  Skin:    General: Skin is warm and dry.  Neurological:     General: No focal deficit present.     Mental Status: He is alert and oriented to person, place, and time.  Psychiatric:        Mood and Affect: Mood normal.        Thought Content: Thought content normal.        Judgment: Judgment normal.     Imaging: US Venous Img Lower Bilateral (DVT)  Result Date: 12/20/2019 CLINICAL DATA:  59 year old with history of right sacroiliac fusion. Patient had an IVC filter placed prior to the surgery. Patient is being evaluated for IVC filter retrieval. EXAM: BILATERAL LOWER EXTREMITY VENOUS DOPPLER ULTRASOUND TECHNIQUE: Gray-scale sonography with graded compression, as well as color Doppler and duplex ultrasound were performed to evaluate the lower extremity deep venous systems from the level of the common femoral vein and including the common femoral, femoral, profunda femoral, popliteal and calf  veins including the posterior tibial, peroneal and gastrocnemius veins when visible. The superficial great saphenous vein was also interrogated. Spectral Doppler was utilized to evaluate flow at rest and with distal augmentation maneuvers in the common femoral, femoral and popliteal veins. COMPARISON:  10/27/2018 FINDINGS: RIGHT LOWER EXTREMITY Common Femoral Vein: No evidence of thrombus. Normal compressibility, respiratory phasicity and response to augmentation. Saphenofemoral Junction: No evidence of thrombus. Normal compressibility and flow on color Doppler imaging. Profunda Femoral Vein: No evidence of thrombus. Normal compressibility and flow  on color Doppler imaging. Femoral Vein: No evidence of thrombus. Normal compressibility, respiratory phasicity and response to augmentation. Popliteal Vein: No evidence of thrombus. Normal compressibility, respiratory phasicity and response to augmentation. Calf Veins: No evidence of thrombus. Normal compressibility and flow on color Doppler imaging. Superficial Great Saphenous Vein: No evidence of thrombus. Normal compressibility. Other Findings:  None. LEFT LOWER EXTREMITY Common Femoral Vein: No evidence of thrombus. Normal compressibility, respiratory phasicity and response to augmentation. Saphenofemoral Junction: No evidence of thrombus. Normal compressibility and flow on color Doppler imaging. Profunda Femoral Vein: No evidence of thrombus. Normal compressibility and flow on color Doppler imaging. Femoral Vein: No evidence of thrombus. Normal compressibility, respiratory phasicity and response to augmentation. Popliteal Vein: No evidence of thrombus. Normal compressibility, respiratory phasicity and response to augmentation. Calf Veins: No evidence of thrombus. Normal compressibility and flow on color Doppler imaging. Superficial Great Saphenous Vein: No evidence of thrombus. Normal compressibility. Other Findings:  None. IMPRESSION: No evidence of deep venous  thrombosis in either lower extremity. Electronically Signed   By: Richarda Overlie M.D.   On: 12/20/2019 12:20   IR Radiologist Eval & Mgmt  Result Date: 12/20/2019 Please refer to notes tab for details about interventional procedure. (Op Note)   Labs:  CBC: Recent Labs    09/27/19 0913 10/09/19 0942  WBC 6.5 6.6  HGB 15.1 16.4  HCT 46.8 50.0  PLT 273 280    COAGS: Recent Labs    09/27/19 0913 10/09/19 0942  INR 1.3* 1.0  APTT  --  29    BMP: Recent Labs    09/27/19 0913 10/09/19 0942  NA 140 139  K 4.2 4.1  CL 106 104  CO2 25 24  GLUCOSE 97 102*  BUN 13 14  CALCIUM 9.1 9.7  CREATININE 0.90 0.87  GFRNONAA >60 >60  GFRAA >60 >60    Assessment and Plan: Hx of DVT IVC filter placement for surgery. Now plan for retrieval today Risks and benefits discussed with the patient including, but not limited to bleeding, infection, contrast induced renal failure, filter fracture or migration which can lead to emergency surgery or even death, strut penetration with damage or irritation to adjacent structures and caval thrombosis.  All of the patient's questions were answered, patient is agreeable to proceed. Consent signed and in chart.    Thank you for this interesting consult.  I greatly enjoyed meeting John Guzman and look forward to participating in their care.  A copy of this report was sent to the requesting provider on this date.  Electronically Signed: Brayton El, PA-C 12/27/2019, 9:23 AM   I spent a total of 20 minutes in face to face in clinical consultation, greater than 50% of which was counseling/coordinating care for IVC filter retrieval

## 2019-12-27 NOTE — Discharge Instructions (Signed)
Inferior Vena Cava Filter Removal, Care After This sheet gives you information about how to care for yourself after your procedure. Your health care provider may also give you more specific instructions. If you have problems or questions, contact your health care provider. What can I expect after the procedure? After the procedure, it is common to have:  Mild pain and bruising around your incision in your neck or groin.  Fatigue. Follow these instructions at home: Incision care   Follow instructions from your health care provider about how to take care of your incision. Make sure you: ? Wash your hands with soap and water before you change your bandage (dressing). If soap and water are not available, use hand sanitizer. ? Change your dressing as told by your health care provider.  Check your incision area every day for signs of infection. Check for: ? Redness, swelling, or more pain. ? Fluid or blood. ? Warmth. ? Pus or a bad smell. General instructions  Take over-the-counter and prescription medicines only as told by your health care provider.  Do not take baths, swim, or use a hot tub until your health care provider approves. Ask your health care provider if you may take showers. You may only be allowed to take sponge baths.  Do not drive for 24 hours if you were given a medicine to help you relax (sedative) during your procedure.  Return to your normal activities as told by your health care provider. Ask your health care provider what activities are safe for you.  Keep all follow-up visits as told by your health care provider. This is important. Contact a health care provider if:  You have chills or a fever.  You have redness, swelling, or more pain around your incision.  Your incision feels warm to the touch.  You have pus or a bad smell coming from your incision. Get help right away if:  You have blood coming from your incision (active bleeding). ? If you have  bleeding from the incision site, lie down, apply pressure to the area with a clean cloth or gauze, and get help right away.  You have chest pain.  You have difficulty breathing. Summary  Follow instructions from your health care provider about how to take care of your incision.  Return to your normal activities as told by your health care provider.  Check your incision area every day for signs of infection.  Get help right away if you have active bleeding, chest pain, or trouble breathing. This information is not intended to replace advice given to you by your health care provider. Make sure you discuss any questions you have with your health care provider. Document Revised: 09/17/2017 Document Reviewed: 04/15/2017 Elsevier Patient Education  2020 [employer]  

## 2019-12-27 NOTE — Procedures (Signed)
Interventional Radiology Procedure:   Indications: IVC filter is no longer needed.  Procedure: IVC venogram and filter retrieval  Findings: Successful retrieval of IVC filter  Complications: No immediate     EBL: less than 20 ml  Plan: Bedrest for 2 hours, then discharge to home.    Roland Prine R. Lowella Dandy, MD  Pager: [phone]

## 2021-11-28 ENCOUNTER — Other Ambulatory Visit: Payer: Self-pay | Admitting: Neurosurgery

## 2021-11-28 DIAGNOSIS — M5416 Radiculopathy, lumbar region: Secondary | ICD-10-CM

## 2021-11-28 DIAGNOSIS — G8929 Other chronic pain: Secondary | ICD-10-CM

## 2021-11-28 DIAGNOSIS — M5412 Radiculopathy, cervical region: Secondary | ICD-10-CM

## 2021-12-10 ENCOUNTER — Ambulatory Visit
Admission: RE | Admit: 2021-12-10 | Discharge: 2021-12-10 | Disposition: A | Discharge status: Home / Self Care | Payer: No Typology Code available for payment source | Source: Ambulatory Visit | Attending: Neurosurgery | Admitting: Neurosurgery

## 2021-12-10 ENCOUNTER — Other Ambulatory Visit: Payer: Self-pay

## 2021-12-10 DIAGNOSIS — G8929 Other chronic pain: Secondary | ICD-10-CM

## 2021-12-10 DIAGNOSIS — M533 Sacrococcygeal disorders, not elsewhere classified: Secondary | ICD-10-CM

## 2021-12-11 ENCOUNTER — Ambulatory Visit
Admission: RE | Admit: 2021-12-11 | Discharge: 2021-12-11 | Disposition: A | Discharge status: Home / Self Care | Payer: No Typology Code available for payment source | Source: Ambulatory Visit | Attending: Neurosurgery | Admitting: Neurosurgery

## 2021-12-11 DIAGNOSIS — M5412 Radiculopathy, cervical region: Secondary | ICD-10-CM

## 2021-12-22 ENCOUNTER — Ambulatory Visit
Admission: RE | Admit: 2021-12-22 | Discharge: 2021-12-22 | Disposition: A | Discharge status: Home / Self Care | Payer: No Typology Code available for payment source | Source: Ambulatory Visit | Attending: Neurosurgery | Admitting: Neurosurgery

## 2021-12-22 ENCOUNTER — Other Ambulatory Visit: Payer: Self-pay

## 2021-12-22 DIAGNOSIS — M5416 Radiculopathy, lumbar region: Secondary | ICD-10-CM

## 2022-06-07 IMAGING — CT CT L SPINE W/O CM
1 of 8 series · 5 of 14 positions shown, 7 images · non-contrast
Comparison: Correlation made with images of MRI from May 2021
outside institution

CLINICAL DATA: Low back pain, right lower extremity pain, prior
trauma while in [employer]



[Series 3: l spine soft · axial · 0.44mm/px · z∈[+431,+599]mm · 5 of 127 slices shown, 7 images]
[im 22/127  soft-tissue]
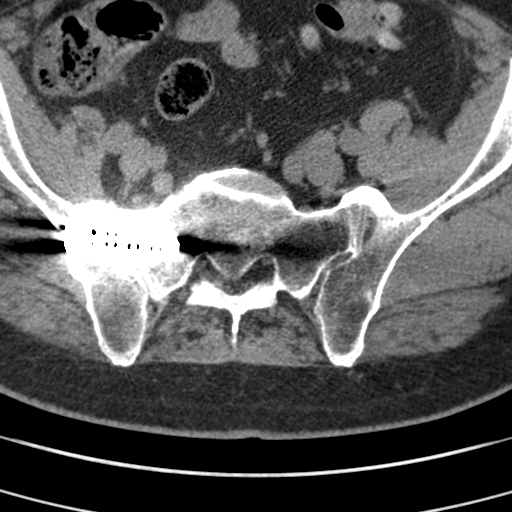
[im 22/127  bone]
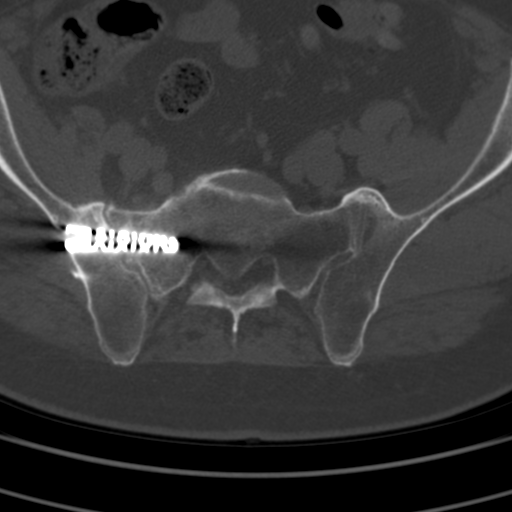
[im 43/127  bone]
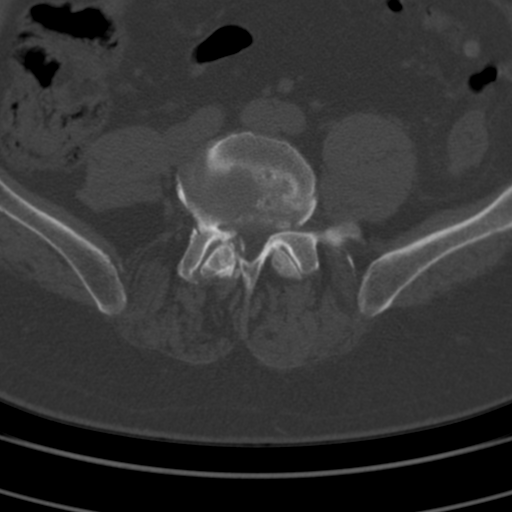
[im 64/127  bone]
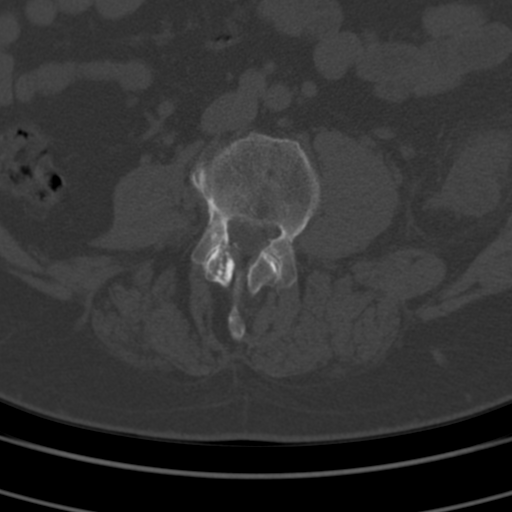
[im 85/127  bone]
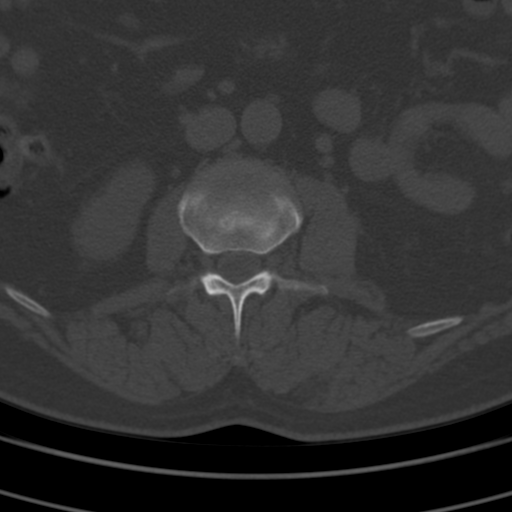
[im 106/127  soft-tissue]
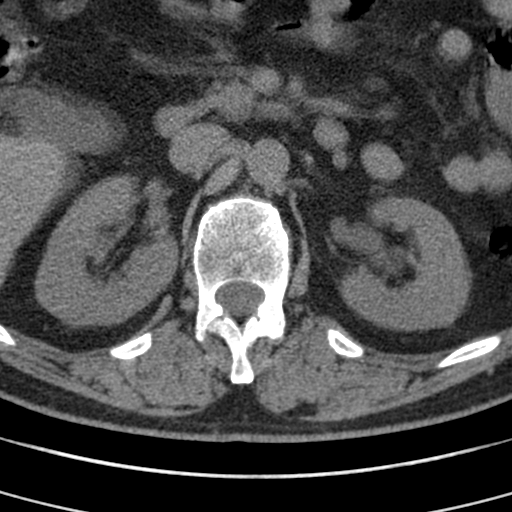
[im 106/127  bone]
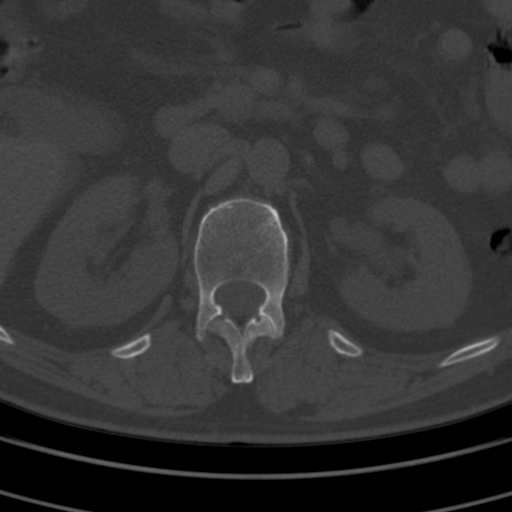

[5 of 14 positions shown; findings below may reference images not displayed]

FINDINGS: Segmentation: There is transitional anatomy with partial
lumbarization of S1.

Alignment: No significant listhesis.

Vertebrae: No acute fracture. Fusion of facets and spinous processes
at L4-L5. Fusion of facets at L3-L4.

Paraspinal and other soft tissues: Colonic diverticulosis. Minor
atherosclerosis. Right sacroiliac joint fusion with associated
streak artifact.

Disc levels: Disc space narrowing is greatest at L3-L4 and L4-L5.

L1-L2:  No canal or foraminal stenosis.

L2-L3:  Facet hypertrophy.  No canal or foraminal stenosis.

L3-L4: Disc bulge with endplate osteophytic ridging. Facet
hypertrophy and fusion. Minor canal stenosis. Narrowing of the right
greater than left subarticular recesses. Mild foraminal stenosis.

L4-L5: Endplate osteophytes. Facet hypertrophy and fusion. No canal
or left foraminal stenosis. Mild right foraminal stenosis.

L5-S1: Disc bulge with endplate osteophytic ridging. Facet
hypertrophy and ligamentum flavum thickening. Probable moderate
canal stenosis. Probable moderate to marked right and moderate left
foraminal stenosis.

S1-S2: No stenosis.
IMPRESSION: Multilevel degenerative changes as detailed above, greatest at L5-S1
(transitional anatomy is present). Findings are similar to prior
MRI.
